# Patient Record
Sex: Female | Born: 1995 | Race: White | Hispanic: No | Smoking: Never smoker
Health system: Southern US, Community
[De-identification: ages and names within clinical notes are randomized; demographics above are authoritative.]

## PROBLEM LIST (undated history)

## (undated) ENCOUNTER — Inpatient Hospital Stay (HOSPITAL_COMMUNITY): Payer: Self-pay

## (undated) DIAGNOSIS — R55 Syncope and collapse: Secondary | ICD-10-CM

## (undated) DIAGNOSIS — N390 Urinary tract infection, site not specified: Secondary | ICD-10-CM

## (undated) DIAGNOSIS — Z789 Other specified health status: Secondary | ICD-10-CM

## (undated) HISTORY — DX: Syncope and collapse: R55

## (undated) HISTORY — PX: TOOTH EXTRACTION: SUR596

---
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
## 2013-01-20 ENCOUNTER — Telehealth: Payer: Self-pay | Admitting: Family Medicine

## 2013-01-20 ENCOUNTER — Encounter: Payer: Self-pay | Admitting: Family Medicine

## 2013-01-20 ENCOUNTER — Ambulatory Visit (INDEPENDENT_AMBULATORY_CARE_PROVIDER_SITE_OTHER): Payer: PRIVATE HEALTH INSURANCE | Admitting: Family Medicine

## 2013-01-20 VITALS — BP 120/64 | HR 88 | Temp 98.2°F | Ht 63.25 in | Wt 104.5 lb

## 2013-01-20 DIAGNOSIS — R109 Unspecified abdominal pain: Secondary | ICD-10-CM

## 2013-01-20 DIAGNOSIS — R1031 Right lower quadrant pain: Secondary | ICD-10-CM

## 2013-01-20 DIAGNOSIS — R10A0 Flank pain, unspecified side: Secondary | ICD-10-CM

## 2013-01-20 LAB — COMPREHENSIVE METABOLIC PANEL
ALT: 10 U/L (ref 0–35)
AST: 13 U/L (ref 0–37)
Alkaline Phosphatase: 64 U/L (ref 39–117)
Calcium: 9.1 mg/dL (ref 8.4–10.5)
Chloride: 106 mEq/L (ref 96–112)
Creatinine, Ser: 0.9 mg/dL (ref 0.4–1.2)

## 2013-01-20 LAB — POCT URINALYSIS DIPSTICK
Nitrite, UA: NEGATIVE
Urobilinogen, UA: NEGATIVE
pH, UA: 7

## 2013-01-20 LAB — CBC WITH DIFFERENTIAL/PLATELET
Basophils Absolute: 0 10*3/uL (ref 0.0–0.1)
Eosinophils Absolute: 0.1 10*3/uL (ref 0.0–0.7)
Hemoglobin: 13.6 g/dL (ref 12.0–15.0)
Lymphocytes Relative: 19 % (ref 12.0–46.0)
MCHC: 34 g/dL (ref 30.0–36.0)
Neutro Abs: 6.3 10*3/uL (ref 1.4–7.7)
Neutrophils Relative %: 73.1 % (ref 43.0–77.0)
RDW: 13.3 % (ref 11.5–14.6)

## 2013-01-20 MED ORDER — CIPROFLOXACIN HCL 250 MG PO TABS: 250.0000 mg | ORAL_TABLET | Freq: Two times a day (BID) | ORAL | Status: DC

## 2013-01-20 NOTE — Assessment & Plan Note (Addendum)
Many WBC in urine, occ skin cells... No clear UTI symptoms other than abdominal pain... Will send for culture.  Will start her on Cipro x 3 days. Did not evaluate for STDs given pt states she has never had sex... U preg negative. Some concern for appendicitis given level of pain and movement of pain to right lower quadrant, but pt very comfortable in office. Will check a cbc today, if elevated.. Send for CT. We will hold on CT of abdomen at this time but mother instructed if pain worsening... Go to ER for CT to evaluate for appendicitis.

## 2013-01-20 NOTE — Telephone Encounter (Signed)
Patient Information:  Caller Name: Cala Bradford  Phone: [phone]  Patient: Heidi Orr, Heidi Orr  Gender: Female  DOB: [birth year]  Age: 17 Years  PCP: Kerby Nora (Family Practice)  Pregnant: No  Office Follow Up:  Does the office need to follow up with this patient?: No  Instructions For The Office: N/A   Symptoms  Reason For Call & Symptoms: Mom states child has complained of left side pain.  Reviewed Health History In EMR: Yes  Reviewed Medications In EMR: Yes  Reviewed Allergies In EMR: Yes  Reviewed Surgeries / Procedures: Yes  Date of Onset of Symptoms: 01/19/2013  Treatments Tried: Tylenol 1 tab  Treatments Tried Worked: No OB / GYN:  LMP: 01/09/2013  Guideline(s) Used:  Abdominal Pain - Female  Disposition Per Guideline:   See Today in Office  Reason For Disposition Reached:   Moderate or mild pain that comes and goes (cramps) lasts > 24 hours  Advice Given:  Call Back If:  You become worse.  Patient Will Follow Care Advice:  YES  Appointment Scheduled:  01/20/2013 11:45:00 Appointment Scheduled Provider:  Kerby Nora [facility])

## 2013-01-20 NOTE — Progress Notes (Signed)
  Subjective:    Patient ID: Heidi Orr, female    DOB: [birth year], 17 y.o.   MRN: [id number]  HPI  17 year old female previously healthy presents with 24 hours of new onset pain in left side when she awoke. Intermittent left mid lateral abdomen, sharp stabbing pain. Went away but then it came back on right side.  3/10 on pain scale at this moment..yesterday >10/10. No associated symptoms initially... Later in the day felt nausea, no emesis.  No Constipation or diarrhea, last BM yesterday.  No gas or bloating. No fever. No recent travel, no antibiotics. No dysuria.  Has tolerated jello, sandwich, drinking  Sugar-free koolaid.  Last menses 5/14, regular. She states she has never had sex.   Review of Systems  Constitutional: Negative for fever and fatigue.  HENT: Negative for ear pain.   Eyes: Negative for pain.  Respiratory: Negative for chest tightness and shortness of breath.   Cardiovascular: Negative for chest pain, palpitations and leg swelling.  Gastrointestinal: Negative for abdominal pain.  Genitourinary: Negative for dysuria.       Objective:   Physical Exam  Constitutional: Vital signs are normal. She appears well-developed and well-nourished. She is cooperative.  Non-toxic appearance. She does not appear ill. No distress.  HENT:  Head: Normocephalic.  Right Ear: Hearing, tympanic membrane, external ear and ear canal normal. Tympanic membrane is not erythematous, not retracted and not bulging.  Left Ear: Hearing, tympanic membrane, external ear and ear canal normal. Tympanic membrane is not erythematous, not retracted and not bulging.  Nose: No mucosal edema or rhinorrhea. Right sinus exhibits no maxillary sinus tenderness and no frontal sinus tenderness. Left sinus exhibits no maxillary sinus tenderness and no frontal sinus tenderness.  Mouth/Throat: Uvula is midline, oropharynx is clear and moist and mucous membranes are normal.  Eyes: Conjunctivae, EOM and lids  are normal. Pupils are equal, round, and reactive to light. No foreign bodies found.  Neck: Trachea normal and normal range of motion. Neck supple. Carotid bruit is not present. No mass and no thyromegaly present.  Cardiovascular: Normal rate, regular rhythm, S1 normal, S2 normal, normal heart sounds, intact distal pulses and normal pulses.  Exam reveals no gallop and no friction rub.   No murmur heard. Pulmonary/Chest: Effort normal and breath sounds normal. Not tachypneic. No respiratory distress. She has no decreased breath sounds. She has no wheezes. She has no rhonchi. She has no rales.  Abdominal: Soft. Normal appearance and bowel sounds are normal. There is no hepatosplenomegaly. There is tenderness in the right upper quadrant, right lower quadrant, periumbilical area and suprapubic area. There is no rigidity, no rebound, no guarding, no CVA tenderness, no tenderness at McBurney's point and negative Murphy's sign.  Pt is very comfortable on exam  Neurological: She is alert.  Skin: Skin is warm, dry and intact. No rash noted.  Psychiatric: Her speech is normal and behavior is normal. Judgment and thought content normal. Her mood appears not anxious. Cognition and memory are normal. She does not exhibit a depressed mood.          Assessment & Plan:

## 2013-01-20 NOTE — Patient Instructions (Addendum)
Push fluids. Complete antibiotics. Stop at lab on your way out. If not improving with treatment for urinary infection.. Follow up on Tuesday. Go to ER if pain in right lower abdomen increasing.

## 2013-01-22 LAB — URINE CULTURE: Colony Count: >=100000

## 2013-02-02 ENCOUNTER — Ambulatory Visit: Payer: PRIVATE HEALTH INSURANCE | Admitting: Family Medicine

## 2013-02-03 ENCOUNTER — Ambulatory Visit: Payer: Medicare PPO | Admitting: Family Medicine

## 2013-02-14 ENCOUNTER — Ambulatory Visit (INDEPENDENT_AMBULATORY_CARE_PROVIDER_SITE_OTHER): Payer: PRIVATE HEALTH INSURANCE | Admitting: Family Medicine

## 2013-02-14 ENCOUNTER — Encounter: Payer: Self-pay | Admitting: Family Medicine

## 2013-02-14 VITALS — BP 90/60 | HR 83 | Temp 98.1°F | Ht 63.25 in | Wt 102.2 lb

## 2013-02-14 DIAGNOSIS — N920 Excessive and frequent menstruation with regular cycle: Secondary | ICD-10-CM

## 2013-02-14 DIAGNOSIS — Z0000 Encounter for general adult medical examination without abnormal findings: Secondary | ICD-10-CM

## 2013-02-14 MED ORDER — NORETHINDRONE ACET-ETHINYL EST 1-20 MG-MCG PO TABS: ORAL_TABLET | ORAL | Status: DC

## 2013-02-14 NOTE — Assessment & Plan Note (Signed)
Stable on OCPs.

## 2013-02-14 NOTE — Progress Notes (Signed)
Subjective:    Patient ID: Heidi Orr, female    DOB: 1995-11-15, 17 y.o.   MRN: [id number]  HPI  The patient is here for annual wellness exam and preventative care.    She had resolution of abdominal pain with treatment of UTI. No urinary symptoms at this time.  Has noted umbilicus red looking x 1 week.  No pain. No itching.  Menorrhagia: stable on microgestin.  History   Social History  . Marital Status: Single    Spouse Name: N/A    Number of Children: N/A  . Years of Education: N/A   Occupational History  . Student    Social History Main Topics  . Smoking status: Never Smoker   . Smokeless tobacco: None  . Alcohol Use: No  . Drug Use: No  . Sexually Active: No   Other Topics Concern  . None   Social History Narrative  . None  No regular exercise. Limited veggies, good fruit, occ fast food     Review of Systems  Constitutional: Negative for fever, fatigue and unexpected weight change.  HENT: Negative for ear pain, congestion, sore throat, sneezing, trouble swallowing and sinus pressure.   Eyes: Negative for pain and itching.  Respiratory: Negative for cough, chest tightness, shortness of breath and wheezing.   Cardiovascular: Negative for chest pain, palpitations and leg swelling.  Gastrointestinal: Negative for nausea, abdominal pain, diarrhea, constipation and blood in stool.  Genitourinary: Negative for dysuria, hematuria, vaginal discharge, difficulty urinating and menstrual problem.  Skin: Negative for rash.  Neurological: Negative for syncope, weakness, light-headedness, numbness and headaches.  Psychiatric/Behavioral: Negative for confusion and dysphoric mood. The patient is not nervous/anxious.        Objective:   Physical Exam  Constitutional: Vital signs are normal. She appears well-developed and well-nourished. She is cooperative.  Non-toxic appearance. She does not appear ill. No distress.  HENT:  Head: Normocephalic.  Right Ear:  Hearing, tympanic membrane, external ear and ear canal normal.  Left Ear: Hearing, tympanic membrane, external ear and ear canal normal.  Nose: Nose normal.  Eyes: Conjunctivae, EOM and lids are normal. Pupils are equal, round, and reactive to light. No foreign bodies found.  Neck: Trachea normal and normal range of motion. Neck supple. Carotid bruit is not present. No mass and no thyromegaly present.  Cardiovascular: Normal rate, regular rhythm, S1 normal, S2 normal, normal heart sounds and intact distal pulses.  Exam reveals no gallop.   No murmur heard. Pulmonary/Chest: Effort normal and breath sounds normal. No respiratory distress. She has no wheezes. She has no rhonchi. She has no rales.  Abdominal: Soft. Normal appearance and bowel sounds are normal. She exhibits no distension, no fluid wave, no abdominal bruit and no mass. There is no hepatosplenomegaly. There is no tenderness. There is no rebound, no guarding and no CVA tenderness. No hernia.  Lymphadenopathy:    She has no cervical adenopathy.    She has no axillary adenopathy.  Neurological: She is alert. She has normal strength. No cranial nerve deficit or sensory deficit.  Skin: Skin is warm, dry and intact. No rash noted.  Psychiatric: Her speech is normal and behavior is normal. Judgment normal. Her mood appears not anxious. Cognition and memory are normal. She does not exhibit a depressed mood.          Assessment & Plan:  The patient's preventative maintenance and recommended screening tests for an annual wellness exam were reviewed in full today. Brought up to  date unless services declined.  Counselled on the importance of diet, exercise, and its role in overall health and mortality. The patient's FH and SH was reviewed, including their home life, tobacco status, and drug and alcohol status.   Uptodate with HPV, Td, meningitis No pap until age 42.  No need for std testing, no sexual activity.

## 2013-02-14 NOTE — Patient Instructions (Addendum)
Can try small amount of monistat on belly button daily for 7 days for possible small yeast infection.  Limit fast food, increase veggies. Drink water.

## 2013-04-07 ENCOUNTER — Other Ambulatory Visit: Payer: Self-pay | Admitting: *Deleted

## 2013-04-07 MED ORDER — NORETHINDRONE ACET-ETHINYL EST 1-20 MG-MCG PO TABS: ORAL_TABLET | ORAL | Status: DC

## 2013-10-18 ENCOUNTER — Other Ambulatory Visit: Payer: Self-pay

## 2013-10-18 MED ORDER — NORETHINDRONE ACET-ETHINYL EST 1-20 MG-MCG PO TABS: ORAL_TABLET | ORAL | Status: DC

## 2013-10-18 NOTE — Telephone Encounter (Signed)
pts mother request one pack ob BC pill to [facility] until gets mail order.advised done.

## 2013-12-11 ENCOUNTER — Other Ambulatory Visit: Payer: Self-pay | Admitting: Family Medicine

## 2013-12-11 MED ORDER — NORETHIN ACE-ETH ESTRAD-FE 1-20 MG-MCG PO TABS: 1.0000 | ORAL_TABLET | Freq: Every day | ORAL | Status: DC

## 2013-12-11 NOTE — Telephone Encounter (Signed)
[facility] faxed refill request for Junel 1/20.  Last physical 01/2013.  I don't see this medication on hx meds or current med list.

## 2013-12-12 ENCOUNTER — Other Ambulatory Visit: Payer: Self-pay | Admitting: *Deleted

## 2013-12-12 MED ORDER — NORETHIN ACE-ETH ESTRAD-FE 1-20 MG-MCG PO TABS: 1.0000 | ORAL_TABLET | Freq: Every day | ORAL | Status: DC

## 2014-01-10 ENCOUNTER — Ambulatory Visit (INDEPENDENT_AMBULATORY_CARE_PROVIDER_SITE_OTHER): Payer: PRIVATE HEALTH INSURANCE | Admitting: Family Medicine

## 2014-01-10 ENCOUNTER — Encounter: Payer: Self-pay | Admitting: Family Medicine

## 2014-01-10 VITALS — BP 92/62 | HR 79 | Temp 97.8°F | Ht 62.75 in | Wt 100.0 lb

## 2014-01-10 DIAGNOSIS — K589 Irritable bowel syndrome without diarrhea: Secondary | ICD-10-CM | POA: Insufficient documentation

## 2014-01-10 DIAGNOSIS — N915 Oligomenorrhea, unspecified: Secondary | ICD-10-CM

## 2014-01-10 DIAGNOSIS — R109 Unspecified abdominal pain: Secondary | ICD-10-CM

## 2014-01-10 DIAGNOSIS — R1030 Lower abdominal pain, unspecified: Secondary | ICD-10-CM

## 2014-01-10 LAB — POCT URINALYSIS DIPSTICK
BILIRUBIN UA: NEGATIVE
Blood, UA: NEGATIVE
GLUCOSE UA: NEGATIVE
Ketones, UA: NEGATIVE
Leukocytes, UA: NEGATIVE
NITRITE UA: NEGATIVE
Protein, UA: NEGATIVE
UROBILINOGEN UA: 0.2
pH, UA: 6

## 2014-01-10 NOTE — Patient Instructions (Signed)
Urinalysis today - we will call you with results  Watch your temp for fever  Get zantac over the counter 150 mg and take it twice daily - 2 weeks and if no improvement let us know  Avoid spicy food / also carbonated beverages

## 2014-01-10 NOTE — Progress Notes (Signed)
Pre visit review using our clinic review tool, if applicable. No additional management support is needed unless otherwise documented below in the visit note. 

## 2014-01-10 NOTE — Progress Notes (Signed)
Subjective:    Patient ID: Heidi MassonKatie L Orr, female    DOB: 1995/09/22, 18 y.o.   MRN: [id number]  HPI Here for irregular periods   For 1-2 months having problems  Intermittently for days at a time (2-3 days) she gets abdominal pain -- from middle down  No particular trigger  If she eats during an episode it will worsen  Also nauseated - just yesterday - no vomiting , nausea lasted all day with stomach pain also  Was out of work  Was sweating and ? 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 Menorrhagia 11/18/2011   Past Medical History  Diagnosis Date  . Syncope 2009   No past surgical history on file. History  Substance Use Topics  . Smoking status: Never Smoker   . Smokeless tobacco: Not on file  . Alcohol Use: No   No family history on file. No Known Allergies Current Outpatient Prescriptions on File Prior to Visit  Medication Sig Dispense Refill  . norethindrone-ethinyl estradiol (JUNEL FE 1/20) 1-20 MG-MCG tablet Take 1 tablet by mouth daily.  3 Package  0   No current facility-administered medications on file prior to visit.     Review of Systems Review of Systems  Constitutional: Negative for fever, appetite change, fatigue and unexpected weight change.  Eyes: Negative for pain and visual disturbance.  Respiratory: Negative for cough and shortness of breath.   Cardiovascular: Negative for cp or palpitations    Gastrointestinal: Negative for  diarrhea and constipation. neg for blood in stool or rectal pain Genitourinary: Negative for urgency  and frequency. neg for hematuria or flank pain  Skin: Negative for pallor or rash   Neurological: Negative for weakness, light-headedness, numbness and headaches.  Hematological: Negative for adenopathy. Does not bruise/bleed easily.  Psychiatric/Behavioral: Negative for dysphoric mood. The patient is not nervous/anxious.         Objective:   Physical Exam  Constitutional: She appears well-developed and well-nourished. No distress.  HENT:  Head: Normocephalic and atraumatic.  Mouth/Throat: Oropharynx is clear and moist.  Eyes: Conjunctivae and EOM are normal. Pupils are equal, round, and reactive to light. Right eye exhibits no discharge. Left eye exhibits no discharge. No scleral icterus.  Neck: Normal range of motion. Neck supple.  Cardiovascular: Normal rate, regular rhythm and intact distal pulses.   Pulmonary/Chest: Effort normal and breath sounds normal. No respiratory distress. She has no wheezes. She has no rales.  Abdominal: Soft. Bowel sounds are normal. She exhibits no distension and no mass. There is tenderness. There is no rebound and no guarding.  Mild suprapubic tenderness without rebound or guarding   Musculoskeletal: She exhibits no edema and no tenderness.  Lymphadenopathy:    She has no cervical adenopathy.  Neurological: She is alert.  Skin: Skin is warm and dry. No rash noted. No erythema. No pallor.  Psychiatric: She has a normal mood and affect.  Assessment & Plan:

## 2014-01-11 NOTE — Assessment & Plan Note (Signed)
Suspect this is due to her OC -periods are light but regular and not skipping them  I adv her to continue current OC unless period stops or changes further

## 2014-01-11 NOTE — Assessment & Plan Note (Signed)
ua and urine preg test neg today  This occurs intermittently - ? If resp to acid or rel to gastritis (no lower GI symptoms) Trial of zantac 150 bid and update  If no imp consider pelvic US  / and f/u with PCP

## 2014-01-18 ENCOUNTER — Telehealth: Payer: Self-pay | Admitting: Family Medicine

## 2014-01-18 DIAGNOSIS — N915 Oligomenorrhea, unspecified: Secondary | ICD-10-CM

## 2014-01-18 DIAGNOSIS — R102 Pelvic and perineal pain: Secondary | ICD-10-CM

## 2014-01-18 DIAGNOSIS — R1030 Lower abdominal pain, unspecified: Secondary | ICD-10-CM

## 2014-01-18 NOTE — Telephone Encounter (Signed)
Mom called as Dr. Milinda Antisower requested, pt's stomach pain is a little better some days with the medication Zantac (BID), some days Florentina AddisonKatie does not feel like it is helping at all.   Please advise.  Best number to call Vassie MoselleKim Croson is [phone]

## 2014-01-18 NOTE — Telephone Encounter (Signed)
Pt notified US ordered and Marion/Linda will call to schedule appt., f/u appt scheduled with Dr. Ermalene SearingBedsole next Friday 01/26/14

## 2014-01-18 NOTE — Telephone Encounter (Signed)
Is most of her pain in upper or lower abdomen now ? (above or below the belly button) - any other symptoms like nausea or heartburn?

## 2014-01-18 NOTE — Telephone Encounter (Signed)
Given just seen by Dr. Milinda Antisower. Will forward to her for her input.? Move ahead with US as note suggested vs follow up first?

## 2014-01-18 NOTE — Telephone Encounter (Signed)
Mother said the pain is in her lower abd, below belly button, pt isn't having any other sxs just the pain, no nausea or heartburn, please advise

## 2014-01-18 NOTE — Telephone Encounter (Signed)
I want to go forward with a pelvic ultrasound Then please sched f/u with PCP a week later  Will order that now

## 2014-01-19 ENCOUNTER — Ambulatory Visit
Admission: RE | Admit: 2014-01-19 | Discharge: 2014-01-19 | Disposition: A | Discharge status: Home / Self Care | Payer: PRIVATE HEALTH INSURANCE | Source: Ambulatory Visit | Attending: Family Medicine | Admitting: Family Medicine

## 2014-01-19 DIAGNOSIS — R102 Pelvic and perineal pain: Secondary | ICD-10-CM

## 2014-01-19 DIAGNOSIS — R1030 Lower abdominal pain, unspecified: Secondary | ICD-10-CM

## 2014-01-19 DIAGNOSIS — N915 Oligomenorrhea, unspecified: Secondary | ICD-10-CM

## 2014-01-26 ENCOUNTER — Encounter: Payer: Self-pay | Admitting: Family Medicine

## 2014-01-26 ENCOUNTER — Ambulatory Visit (INDEPENDENT_AMBULATORY_CARE_PROVIDER_SITE_OTHER): Payer: PRIVATE HEALTH INSURANCE | Admitting: Family Medicine

## 2014-01-26 VITALS — BP 100/60 | HR 62 | Temp 98.2°F | Ht 62.75 in | Wt 99.5 lb

## 2014-01-26 DIAGNOSIS — R1030 Lower abdominal pain, unspecified: Secondary | ICD-10-CM

## 2014-01-26 DIAGNOSIS — R102 Pelvic and perineal pain: Secondary | ICD-10-CM

## 2014-01-26 DIAGNOSIS — R634 Abnormal weight loss: Secondary | ICD-10-CM

## 2014-01-26 DIAGNOSIS — R109 Unspecified abdominal pain: Secondary | ICD-10-CM

## 2014-01-26 LAB — CBC WITH DIFFERENTIAL/PLATELET
BASOS PCT: 0.3 % (ref 0.0–3.0)
Basophils Absolute: 0 10*3/uL (ref 0.0–0.1)
EOS ABS: 0.1 10*3/uL (ref 0.0–0.7)
EOS PCT: 1.3 % (ref 0.0–5.0)
HEMATOCRIT: 39.2 % (ref 36.0–49.0)
Hemoglobin: 13.3 g/dL (ref 12.0–16.0)
LYMPHS ABS: 1.3 10*3/uL (ref 0.7–4.0)
Lymphocytes Relative: 26.3 % (ref 24.0–48.0)
MCHC: 33.9 g/dL (ref 31.0–37.0)
MCV: 87 fl (ref 78.0–98.0)
MONO ABS: 0.4 10*3/uL (ref 0.1–1.0)
Monocytes Relative: 7.4 % (ref 3.0–12.0)
NEUTROS ABS: 3.2 10*3/uL (ref 1.4–7.7)
Neutrophils Relative %: 64.7 % (ref 43.0–71.0)
Platelets: 203 10*3/uL (ref 150.0–575.0)
RBC: 4.51 Mil/uL (ref 3.80–5.70)
RDW: 13.2 % (ref 11.4–15.5)
WBC: 4.9 10*3/uL (ref 4.5–13.5)

## 2014-01-26 LAB — COMPREHENSIVE METABOLIC PANEL
ALBUMIN: 3.9 g/dL (ref 3.5–5.2)
ALK PHOS: 55 U/L (ref 47–119)
ALT: 12 U/L (ref 0–35)
AST: 19 U/L (ref 0–37)
BILIRUBIN TOTAL: 0.4 mg/dL (ref 0.2–1.2)
BUN: 12 mg/dL (ref 6–23)
CO2: 26 mEq/L (ref 19–32)
Calcium: 9.3 mg/dL (ref 8.4–10.5)
Chloride: 107 mEq/L (ref 96–112)
Creatinine, Ser: 0.9 mg/dL (ref 0.4–1.2)
GFR: 82.03 mL/min (ref >60.00)
GLUCOSE: 57 mg/dL — AB (ref 70–99)
POTASSIUM: 4 meq/L (ref 3.5–5.1)
Sodium: 139 mEq/L (ref 135–145)
Total Protein: 7.1 g/dL (ref 6.0–8.3)

## 2014-01-26 LAB — TSH: TSH: 1.18 u[IU]/mL (ref 0.40–5.00)

## 2014-01-26 MED ORDER — DICYCLOMINE HCL 10 MG PO CAPS: ORAL_CAPSULE | ORAL | Status: DC

## 2014-01-26 NOTE — Patient Instructions (Addendum)
Stop at lab on way out. Avoid greasy foods, fried foods. Start daily probiotic (lactobacclii) Librarian, academic.  INcrease fiber and water in diet. If abdominal can use medication for abdominal cramping.  Follow up in 1 month.

## 2014-01-26 NOTE — Progress Notes (Signed)
Pre visit review using our clinic review tool, if applicable. No additional management support is needed unless otherwise documented below in the visit note. 

## 2014-01-26 NOTE — Progress Notes (Signed)
Subjective:    Patient ID: Heidi MassonKatie L Raglin, female    DOB: Jan 25, 1996, 18 y.o.   MRN: [id number]  HPI  18 year old female  With history of chronic abdominal pain presents for follow up abdominal pain,. She was seen on 5/13 by Dr. Milinda Antisower.   Prior history: For 1-2 months having problems  Intermittently for days at a time (2-3 days) she gets abdominal pain -- from middle down  No particular trigger  If she eats during an episode it will worsen  Also nauseated - just yesterday - no vomiting , nausea lasted all day with stomach pain also  Was out of work  Was sweating and ? If she could have had a fever  No diarrhea at all  No constipation or blood in stool   Periods are a bit off at times  Very light and short  No breakthrough bleeding  Not missing any pills  Never sexually active   At that appt  DX: Lower abdominal pain - Judy PimpleMarne A Tower, MD at 01/11/2014 3:55 PM     ua and urine preg test neg today  This occurs intermittently - ? If resp to acid or rel to gastritis (no lower GI symptoms)  Trial of zantac 150 bid and update  If no imp consider pelvic US / and f/u with PCP   Nml US pelvic on 5/20  Nml CMET, cbc 01/24/2013   Interval history: She reports Zantac only helped occ, sometimes mafde symptoms worse. She is having mid abdominal pain intermittently, but much less frequently in last few weeks. Last episode was last night mild episode, 20 min. No known triggers, occ wakes up with it, no clear relationship to meals. No D/ C/ Vomiting. No fever.   She eats well but has been losing weight recently in last year. Eats small amounts all day long.   Se denies anorexia and bulemia. Wt Readings from Last 3 Encounters:  01/26/14 99 lb 8 oz (45.133 kg) (3%*, Z = -1.85)  01/10/14 100 lb (45.36 kg) (4%*, Z = -1.80)  02/14/13 102 lb 4 oz (46.38 kg) (6%*, Z = -1.52)   * Growth percentiles are based on CDC 2-20 Years data.    No sexual activity. Never had sex.  Review of  Systems  Constitutional: Negative for fever and fatigue.  HENT: Negative for ear pain.   Eyes: Negative for pain.  Respiratory: Negative for chest tightness and shortness of breath.   Cardiovascular: Negative for chest pain, palpitations and leg swelling.  Gastrointestinal: Positive for abdominal pain. Negative for nausea, diarrhea, constipation, blood in stool, abdominal distention and anal bleeding.  Genitourinary: Negative for dysuria.       Objective:   Physical Exam  Constitutional: Vital signs are normal. She appears well-developed and well-nourished. She is cooperative.  Non-toxic appearance. She does not appear ill. No distress.  thin  HENT:  Head: Normocephalic.  Right Ear: Hearing, tympanic membrane, external ear and ear canal normal. Tympanic membrane is not erythematous, not retracted and not bulging.  Left Ear: Hearing, tympanic membrane, external ear and ear canal normal. Tympanic membrane is not erythematous, not retracted and not bulging.  Nose: No mucosal edema or rhinorrhea. Right sinus exhibits no maxillary sinus tenderness and no frontal sinus tenderness. Left sinus exhibits no maxillary sinus tenderness and no frontal sinus tenderness.  Mouth/Throat: Uvula is midline, oropharynx is clear and moist and mucous membranes are normal.  Eyes: Conjunctivae, EOM and lids are normal. Pupils are  equal, round, and reactive to light. Lids are everted and swept, no foreign bodies found.  Neck: Trachea normal and normal range of motion. Neck supple. Carotid bruit is not present. No mass and no thyromegaly present.  Cardiovascular: Normal rate, regular rhythm, S1 normal, S2 normal, normal heart sounds, intact distal pulses and normal pulses.  Exam reveals no gallop and no friction rub.   No murmur heard. Pulmonary/Chest: Effort normal and breath sounds normal. Not tachypneic. No respiratory distress. She has no decreased breath sounds. She has no wheezes. She has no rhonchi. She has no  rales.  Abdominal: Soft. Normal appearance and bowel sounds are normal. There is no tenderness.  Neurological: She is alert.  Skin: Skin is warm, dry and intact. No rash noted.  Psychiatric: Her speech is normal and behavior is normal. Judgment and thought content normal. Her mood appears not anxious. Cognition and memory are normal. She does not exhibit a depressed mood.          Assessment & Plan:

## 2014-01-26 NOTE — Assessment & Plan Note (Signed)
Nml pelvic US, neg UA and upreg, no sexual activity.  ? IBS.  Treat with lactobaccli, fiber and can try bentyl prn.  Close follow up. If not improving consider CT abd/pelvis vs referral.

## 2014-01-26 NOTE — Assessment & Plan Note (Signed)
Will re-eval labs.  Pt has always be low weight but now in more dangerous level. Eval with labs.

## 2014-02-06 ENCOUNTER — Other Ambulatory Visit: Payer: Self-pay | Admitting: *Deleted

## 2014-02-06 MED ORDER — NORETHIN ACE-ETH ESTRAD-FE 1-20 MG-MCG PO TABS: 1.0000 | ORAL_TABLET | Freq: Every day | ORAL | Status: DC

## 2014-03-01 ENCOUNTER — Encounter: Payer: Self-pay | Admitting: Family Medicine

## 2014-03-01 ENCOUNTER — Ambulatory Visit (INDEPENDENT_AMBULATORY_CARE_PROVIDER_SITE_OTHER): Payer: PRIVATE HEALTH INSURANCE | Admitting: Family Medicine

## 2014-03-01 VITALS — BP 100/70 | HR 65 | Temp 98.1°F | Ht 62.75 in | Wt 102.0 lb

## 2014-03-01 DIAGNOSIS — K589 Irritable bowel syndrome without diarrhea: Secondary | ICD-10-CM

## 2014-03-01 DIAGNOSIS — R634 Abnormal weight loss: Secondary | ICD-10-CM

## 2014-03-01 NOTE — Progress Notes (Signed)
Pre visit review using our clinic review tool, if applicable. No additional management support is needed unless otherwise documented below in the visit note. 

## 2014-03-01 NOTE — Patient Instructions (Signed)
Continue probiotics.  Work on 3 meals a day with healthy snacks. Can still use bently if needed. Schedule CPX in 3 months, weight check.

## 2014-03-01 NOTE — Assessment & Plan Note (Signed)
Work up so far negative.,  Improved with probitoic.  Work on diet changes and increase in fiber. Use bentyl prn.

## 2014-03-01 NOTE — Progress Notes (Signed)
   Subjective:    Patient ID: Heidi MassonKatie L Orr, female    DOB: 06-11-1996, 18 y.o.   MRN: [id number]  HPI 18 year old female pt presents for follow up abdominal pain.  At last appt she was felt to have ? IBS TSH, cbc, CMET were normal. Nml pelvic US, neg UA and upreg, no sexual activity.  Treat with lactobaccli, fiber and can try bentyl prn.    Wt Readings from Last 3 Encounters:  03/01/14 102 lb (46.267 kg) (5%*, Z = -1.64)  01/26/14 99 lb 8 oz (45.133 kg) (3%*, Z = -1.85)  01/10/14 100 lb (45.36 kg) (4%*, Z = -1.80)   * Growth percentiles are based on CDC 2-20 Years data.    She feels the probiotics have helped. She has not had recurrence of severe abdominal pain. Has not increased fiber. She eats less junk food.  She is not eating three meals. Skip breakfast. Has not need bentyl.    Review of Systems  Constitutional: Negative for fever and fatigue.  HENT: Negative for ear pain.   Eyes: Negative for pain.  Respiratory: Negative for chest tightness and shortness of breath.   Cardiovascular: Negative for chest pain, palpitations and leg swelling.  Gastrointestinal: Negative for abdominal pain.  Genitourinary: Negative for dysuria.       Objective:   Physical Exam  Constitutional: Vital signs are normal. She appears well-developed and well-nourished. She is cooperative.  Non-toxic appearance. She does not appear ill. No distress.  HENT:  Head: Normocephalic.  Right Ear: Hearing, tympanic membrane, external ear and ear canal normal. Tympanic membrane is not erythematous, not retracted and not bulging.  Left Ear: Hearing, tympanic membrane, external ear and ear canal normal. Tympanic membrane is not erythematous, not retracted and not bulging.  Nose: No mucosal edema or rhinorrhea. Right sinus exhibits no maxillary sinus tenderness and no frontal sinus tenderness. Left sinus exhibits no maxillary sinus tenderness and no frontal sinus tenderness.  Mouth/Throat: Uvula is  midline, oropharynx is clear and moist and mucous membranes are normal.  Eyes: Conjunctivae, EOM and lids are normal. Pupils are equal, round, and reactive to light. Lids are everted and swept, no foreign bodies found.  Neck: Trachea normal and normal range of motion. Neck supple. Carotid bruit is not present. No mass and no thyromegaly present.  Cardiovascular: Normal rate, regular rhythm, S1 normal, S2 normal, normal heart sounds, intact distal pulses and normal pulses.  Exam reveals no gallop and no friction rub.   No murmur heard. Pulmonary/Chest: Effort normal and breath sounds normal. Not tachypneic. No respiratory distress. She has no decreased breath sounds. She has no wheezes. She has no rhonchi. She has no rales.  Abdominal: Soft. Normal appearance and bowel sounds are normal. There is no tenderness.  Neurological: She is alert.  Skin: Skin is warm, dry and intact. No rash noted.  Psychiatric: Her speech is normal and behavior is normal. Judgment and thought content normal. Her mood appears not anxious. Cognition and memory are normal. She does not exhibit a depressed mood.          Assessment & Plan:

## 2014-03-01 NOTE — Assessment & Plan Note (Signed)
Improving with improved abdominal pain. Encouraged 3 meals a day with snack healthy diet.

## 2014-04-03 ENCOUNTER — Other Ambulatory Visit: Payer: Self-pay | Admitting: *Deleted

## 2014-04-03 MED ORDER — NORETHIN ACE-ETH ESTRAD-FE 1-20 MG-MCG PO TABS: 1.0000 | ORAL_TABLET | Freq: Every day | ORAL | Status: DC

## 2014-06-01 ENCOUNTER — Encounter: Payer: Self-pay | Admitting: Family Medicine

## 2014-06-01 ENCOUNTER — Ambulatory Visit (INDEPENDENT_AMBULATORY_CARE_PROVIDER_SITE_OTHER): Payer: PRIVATE HEALTH INSURANCE | Admitting: Family Medicine

## 2014-06-01 VITALS — BP 110/60 | HR 70 | Temp 98.0°F | Ht 62.75 in | Wt 102.8 lb

## 2014-06-01 DIAGNOSIS — R634 Abnormal weight loss: Secondary | ICD-10-CM

## 2014-06-01 DIAGNOSIS — K589 Irritable bowel syndrome without diarrhea: Secondary | ICD-10-CM

## 2014-06-01 NOTE — Progress Notes (Signed)
Pre visit review using our clinic review tool, if applicable. No additional management support is needed unless otherwise documented below in the visit note. 

## 2014-06-01 NOTE — Assessment & Plan Note (Signed)
Can try higher dose bentyl to see if it is more effective.

## 2014-06-01 NOTE — Progress Notes (Signed)
   Subjective:    Patient ID: Heidi MassonKatie L Orr, female    DOB: 06/02/1996, 18 y.o.   MRN: [id number]  HPI  18 year old female presents for 3 month follow up abnormal weight loss and IBS. She has maintained her weight since the last OV. She stills skip meals, but is trying to eat more. No bulmia symptoms. She feels good about her appearance. No depression, no anxiety, minimal stress. She has had only 2 episodes of abdominal cramping, she tried bentyl but it did not help at all. No clear triggers. BMI:18.3 She is going to online school. Wt Readings from Last 3 Encounters:  06/01/14 102 lb 12 oz (46.607 kg) (6%*, Z = -1.59)  03/01/14 102 lb (46.267 kg) (5%*, Z = -1.64)  01/26/14 99 lb 8 oz (45.133 kg) (3%*, Z = -1.85)   * Growth percentiles are based on CDC 2-20 Years data.      Review of Systems  Constitutional: Negative for fever and fatigue.  HENT: Negative for ear pain.   Eyes: Negative for pain.  Respiratory: Negative for chest tightness and shortness of breath.   Cardiovascular: Negative for chest pain, palpitations and leg swelling.  Gastrointestinal: Negative for abdominal pain.  Genitourinary: Negative for dysuria.       Objective:   Physical Exam  Constitutional: Vital signs are normal. She appears well-developed and well-nourished. She is cooperative.  Non-toxic appearance. She does not appear ill. No distress.  HENT:  Head: Normocephalic.  Right Ear: Hearing, tympanic membrane, external ear and ear canal normal. Tympanic membrane is not erythematous, not retracted and not bulging.  Left Ear: Hearing, tympanic membrane, external ear and ear canal normal. Tympanic membrane is not erythematous, not retracted and not bulging.  Nose: No mucosal edema or rhinorrhea. Right sinus exhibits no maxillary sinus tenderness and no frontal sinus tenderness. Left sinus exhibits no maxillary sinus tenderness and no frontal sinus tenderness.  Mouth/Throat: Uvula is midline,  oropharynx is clear and moist and mucous membranes are normal.  Eyes: Conjunctivae, EOM and lids are normal. Pupils are equal, round, and reactive to light. Lids are everted and swept, no foreign bodies found.  Neck: Trachea normal and normal range of motion. Neck supple. Carotid bruit is not present. No mass and no thyromegaly present.  Cardiovascular: Normal rate, regular rhythm, S1 normal, S2 normal, normal heart sounds, intact distal pulses and normal pulses.  Exam reveals no gallop and no friction rub.   No murmur heard. Pulmonary/Chest: Effort normal and breath sounds normal. Not tachypneic. No respiratory distress. She has no decreased breath sounds. She has no wheezes. She has no rhonchi. She has no rales.  Abdominal: Soft. Normal appearance and bowel sounds are normal. There is no tenderness.  Neurological: She is alert.  Skin: Skin is warm, dry and intact. No rash noted.  Psychiatric: Her speech is normal and behavior is normal. Judgment and thought content normal. Her mood appears not anxious. Cognition and memory are normal. She does not exhibit a depressed mood.          Assessment & Plan:

## 2014-06-01 NOTE — Patient Instructions (Signed)
Start Pediasure in addition to meals or if you know you are going to skip a meal.  work on increasing healthy eating.  If abdominal cramping you can try 2 tabs of 10 mg bentyl as needed.

## 2014-06-01 NOTE — Assessment & Plan Note (Signed)
Start pediasure. Increase calories.

## 2014-10-01 ENCOUNTER — Telehealth: Payer: Self-pay | Admitting: Family Medicine

## 2014-10-01 ENCOUNTER — Other Ambulatory Visit: Payer: PRIVATE HEALTH INSURANCE

## 2014-10-01 NOTE — Telephone Encounter (Signed)
Does not need routine labs unless interested in STD screening. Please let [pt know.

## 2014-10-01 NOTE — Telephone Encounter (Signed)
-----   Message from Alvina Chouerri J Walsh sent at 09/26/2014  3:11 PM EST ----- Regarding: Lab orders for Monday, 2.1.16 Patient is scheduled for CPX labs, please order future labs, Thanks , Camelia Engerri

## 2014-10-05 ENCOUNTER — Ambulatory Visit (INDEPENDENT_AMBULATORY_CARE_PROVIDER_SITE_OTHER): Payer: PRIVATE HEALTH INSURANCE | Admitting: Family Medicine

## 2014-10-05 ENCOUNTER — Encounter: Payer: Self-pay | Admitting: Family Medicine

## 2014-10-05 VITALS — BP 96/70 | HR 65 | Temp 97.8°F | Ht 63.0 in | Wt 104.0 lb

## 2014-10-05 DIAGNOSIS — Z23 Encounter for immunization: Secondary | ICD-10-CM

## 2014-10-05 DIAGNOSIS — Z2911 Encounter for prophylactic immunotherapy for respiratory syncytial virus (RSV): Secondary | ICD-10-CM

## 2014-10-05 DIAGNOSIS — Z0000 Encounter for general adult medical examination without abnormal findings: Secondary | ICD-10-CM

## 2014-10-05 NOTE — Progress Notes (Signed)
Pre visit review using our clinic review tool, if applicable. No additional management support is needed unless otherwise documented below in the visit note. 

## 2014-10-05 NOTE — Addendum Note (Signed)
Addended by: Damita LackLORING, Mikalyn Hermida S on: 10/05/2014 12:27 PM   Modules accepted: Orders

## 2014-10-05 NOTE — Progress Notes (Signed)
The patient is here for annual wellness exam and preventative care.    She is on OCPs ( microgestin), she has been on it in last  4 years. She had noted no SE, taking regularly. She did not have menses in last 2 months. When she was supposed to get menses she had some typical menses symptoms without the bleeding. No sexually activity. Never has had sex.   She has gained some weight. Trying to eat more regularly. No regular exercise. Limited veggies, good fruit, occ fast food  Wt Readings from Last 3 Encounters:  10/05/14 104 lb (47.174 kg)  06/01/14 102 lb 12 oz (46.607 kg) (6 %*, Z = -1.59)  03/01/14 102 lb (46.267 kg) (5 %*, Z = -1.64)   * Growth percentiles are based on CDC 2-20 Years data.     History   Social History  . Marital Status: Single    Spouse Name: N/A    Number of Children: N/A  . Years of Education: N/A   Occupational History  . Student    Social History Main Topics  . Smoking status: Never Smoker   . Smokeless tobacco: None  . Alcohol Use: No  . Drug Use: No  . Sexually Active: No   Other Topics Concern  . None   Social History Narrative  . None     Review of Systems  Constitutional: Negative for fever, fatigue and unexpected weight change.  HENT: Negative for ear pain, congestion, sore throat, sneezing, trouble swallowing and sinus pressure.  Eyes: Negative for pain and itching.  Respiratory: Negative for cough, chest tightness, shortness of breath and wheezing.  Cardiovascular: Negative for chest pain, palpitations and leg swelling.  Gastrointestinal: Negative for nausea, abdominal pain, diarrhea, constipation and blood in stool.  Genitourinary: Negative for dysuria, hematuria, vaginal discharge, difficulty urinating and menstrual problem.  Skin: Negative for rash.  Neurological: Negative for syncope, weakness, light-headedness, numbness and headaches.  Psychiatric/Behavioral: Negative  for confusion and dysphoric mood. The patient is not nervous/anxious.       Objective:   Physical Exam  Constitutional: Vital signs are normal. She appears well-developed and well-nourished. She is cooperative. Non-toxic appearance. She does not appear ill. No distress.  HENT:  Head: Normocephalic.  Right Ear: Hearing, tympanic membrane, external ear and ear canal normal.  Left Ear: Hearing, tympanic membrane, external ear and ear canal normal.  Nose: Nose normal.  Eyes: Conjunctivae, EOM and lids are normal. Pupils are equal, round, and reactive to light. No foreign bodies found.  Neck: Trachea normal and normal range of motion. Neck supple. Carotid bruit is not present. No mass and no thyromegaly present.  Cardiovascular: Normal rate, regular rhythm, S1 normal, S2 normal, normal heart sounds and intact distal pulses. Exam reveals no gallop.  No murmur heard. Pulmonary/Chest: Effort normal and breath sounds normal. No respiratory distress. She has no wheezes. She has no rhonchi. She has no rales.  Abdominal: Soft. Normal appearance and bowel sounds are normal. She exhibits no distension, no fluid wave, no abdominal bruit and no mass. There is no hepatosplenomegaly. There is no tenderness. There is no rebound, no guarding and no CVA tenderness. No hernia.  Lymphadenopathy:   She has no cervical adenopathy.   She has no axillary adenopathy.  Neurological: She is alert. She has normal strength. No cranial nerve deficit or sensory deficit.  Skin: Skin is warm, dry and intact. No rash noted.  Psychiatric: Her speech is normal and behavior is normal. Judgment normal.  Her mood appears not anxious. Cognition and memory are normal. She does not exhibit a depressed mood.          Assessment & Plan:  The patient's preventative maintenance and recommended screening tests for an annual wellness exam were reviewed in full today. Brought up to date unless services  declined.  Counselled on the importance of diet, exercise, and its role in overall health and mortality. The patient's FH and SH was reviewed, including their home life, tobacco status, and drug and alcohol status.   Uptodate with HPV, Td, meningitis, due for flu No pap until age 68.  No need for std testing, no sexual activity.

## 2014-10-05 NOTE — Patient Instructions (Signed)
Continue working on healthy eating , no skipping meals. Call if menses continues to be irregular for and extended period of time.

## 2014-10-19 ENCOUNTER — Other Ambulatory Visit: Payer: Self-pay

## 2014-10-19 MED ORDER — NORETHIN ACE-ETH ESTRAD-FE 1-20 MG-MCG PO TABS: 1.0000 | ORAL_TABLET | Freq: Every day | ORAL | Status: DC

## 2014-10-19 NOTE — Telephone Encounter (Signed)
Pts mother request refill BC pill to [facility]; advised done.

## 2015-05-26 ENCOUNTER — Emergency Department (INDEPENDENT_AMBULATORY_CARE_PROVIDER_SITE_OTHER)
Admission: EM | Admit: 2015-05-26 | Discharge: 2015-05-26 | Disposition: A | Discharge status: Home / Self Care | Payer: 59 | Source: Home / Self Care | Attending: Emergency Medicine | Admitting: Emergency Medicine

## 2015-05-26 ENCOUNTER — Encounter (HOSPITAL_COMMUNITY): Payer: Self-pay | Admitting: *Deleted

## 2015-05-26 DIAGNOSIS — N390 Urinary tract infection, site not specified: Secondary | ICD-10-CM | POA: Diagnosis not present

## 2015-05-26 HISTORY — DX: Urinary tract infection, site not specified: N39.0

## 2015-05-26 LAB — POCT URINALYSIS DIP (DEVICE)
BILIRUBIN URINE: NEGATIVE
GLUCOSE, UA: 100 mg/dL — AB
KETONES UR: NEGATIVE mg/dL
Nitrite: NEGATIVE
Protein, ur: 30 mg/dL — AB
SPECIFIC GRAVITY, URINE: 1.015 (ref 1.005–1.030)
UROBILINOGEN UA: 0.2 mg/dL (ref 0.0–1.0)
pH: 7.5 (ref 5.0–8.0)

## 2015-05-26 LAB — POCT PREGNANCY, URINE: Preg Test, Ur: NEGATIVE

## 2015-05-26 MED ORDER — PHENAZOPYRIDINE HCL 200 MG PO TABS: 200.0000 mg | ORAL_TABLET | Freq: Three times a day (TID) | ORAL | Status: DC

## 2015-05-26 MED ORDER — CEPHALEXIN 500 MG PO CAPS: 500.0000 mg | ORAL_CAPSULE | Freq: Four times a day (QID) | ORAL | Status: DC

## 2015-05-26 NOTE — Discharge Instructions (Signed)
You have a urinary tract infection. Take the Keflex and Pyridium as prescribed. Follow-up as needed.

## 2015-05-26 NOTE — ED Notes (Signed)
C/O dysuria, urinary urgency, and hematuria since 1400 today.  Has some slight mid low back pain and slight suprapubic pain.  Denies fevers.

## 2015-05-26 NOTE — ED Provider Notes (Signed)
CSN: [id number]     Arrival date & time 05/26/15  1708 History   First MD Initiated Contact with Patient 05/26/15 1727     Chief Complaint  Patient presents with  . Urinary Tract Infection   (Consider location/radiation/quality/duration/timing/severity/associated sxs/prior Treatment) HPI  She is a 19 year old woman here for evaluation of dysuria. She states her symptoms started this afternoon at 2:00. She reports feeling the urge to urinate, but only getting a small amount of urine. She also reports some gross hematuria. She describes some mild suprapubic and low back pain. No flank pain. She did have a brief episode of nausea, but this has resolved. No fevers or chills. No vomiting. She states she did have a vaginal odor last week, but this has resolved.  Past Medical History  Diagnosis Date  . Syncope 2009  . UTI (lower urinary tract infection)    History reviewed. No pertinent past surgical history. No family history on file. Social History  Substance Use Topics  . Smoking status: Never Smoker   . Smokeless tobacco: Never Used  . Alcohol Use: No   OB History    No data available     Review of Systems As in history of present illness Allergies  Review of patient's allergies indicates no known allergies.  Home Medications   Prior to Admission medications   Medication Sig Start Date End Date Taking? Authorizing Provider  norethindrone-ethinyl estradiol (JUNEL FE 1/20) 1-20 MG-MCG tablet Take 1 tablet by mouth daily. 10/19/14  Yes Amy Michelle Nasuti, MD  cephALEXin (KEFLEX) 500 MG capsule Take 1 capsule (500 mg total) by mouth 4 (four) times daily. 05/26/15   Charm Rings, MD  phenazopyridine (PYRIDIUM) 200 MG tablet Take 1 tablet (200 mg total) by mouth 3 (three) times daily. 05/26/15   Charm Rings, MD   Meds Ordered and Administered this Visit  Medications - No data to display  BP 111/76 mmHg  Pulse 87  Temp(Src) 97.7 F (36.5 C) (Oral)  Resp 18  SpO2 99%  LMP 04/27/2015  (Exact Date) No data found.   Physical Exam  Constitutional: She is oriented to person, place, and time. She appears well-developed and well-nourished. No distress.  Cardiovascular: Normal rate.   Pulmonary/Chest: Effort normal.  Abdominal: Soft. Bowel sounds are normal. She exhibits no distension. There is tenderness (mild in suprapubic). There is no rebound and no guarding.  No CVA tenderness  Neurological: She is alert and oriented to person, place, and time.    ED Course  Procedures (including critical care time)  Labs Review Labs Reviewed  POCT URINALYSIS DIP (DEVICE) - Abnormal; Notable for the following:    Glucose, UA 100 (*)    Hgb urine dipstick LARGE (*)    Protein, ur 30 (*)    Leukocytes, UA LARGE (*)    All other components within normal limits  URINE CULTURE  POCT PREGNANCY, URINE    Imaging Review No results found.    MDM   1. UTI (lower urinary tract infection)    Treat with Keflex and Pyridium. Follow-up as needed.    Charm Rings, MD 05/26/15 [phone]

## 2015-05-27 LAB — URINE CULTURE: Culture: NO GROWTH

## 2015-05-28 NOTE — ED Notes (Signed)
Final report of urine C&S negative 

## 2015-07-08 ENCOUNTER — Telehealth: Payer: Self-pay | Admitting: Family Medicine

## 2015-07-08 NOTE — Telephone Encounter (Signed)
TELEPHONE ADVICE RECORD [facility]  Patient Name: Heidi Orr  DOB: [birth year]    Initial Comment Caller States daughter gets sick when she takes her birth control pills. now she has started to spot before her period starts.    Nurse Assessment  Nurse: Odis LusterBowers, RN, Bjorn Loserhonda Date/Time Lamount Cohen(Eastern Time): 07/08/2015 3:04:34 PM  Confirm and document reason for call. If symptomatic, describe symptoms. ---Caller States daughter gets sick (nauseated) when she takes her birth control pills. now she has started to spot before her period starts, usually 1-2 weeks before she is supposed to get her period. Reports that she has been taking BC pills for several years. Last yearly appt was almost 1 year ago. Caller is bleeding now, period is scheduled to start in the next 1-2 weeks.  Has the patient traveled out of the country within the last 30 days? ---No  Does the patient have any new or worsening symptoms? ---Yes  Will a triage be completed? ---Yes  Related visit to physician within the last 2 weeks? ---No  Does the PT have any chronic conditions? (i.e. diabetes, asthma, etc.) ---No  Did the patient indicate they were pregnant? ---No     Guidelines    Guideline Title Affirmed Question Affirmed Notes  Vaginal Bleeding - Abnormal [1] Bleeding or spotting between regular periods AND [2] occurs more than two cycles (2 months) this past year    Final Disposition User   See PCP within 2 Blima DessertWeeks Bowers, RN, Bjorn Loserhonda    Comments  [facility]t [facility] location on Friday 07/12/15 @ 9am with Dr. Ermalene SearingBedsole.   Referrals  REFERRED TO PCP OFFICE   Disagree/Comply: Comply

## 2015-07-12 ENCOUNTER — Encounter (INDEPENDENT_AMBULATORY_CARE_PROVIDER_SITE_OTHER): Payer: Self-pay

## 2015-07-12 ENCOUNTER — Encounter: Payer: Self-pay | Admitting: Family Medicine

## 2015-07-12 ENCOUNTER — Ambulatory Visit (INDEPENDENT_AMBULATORY_CARE_PROVIDER_SITE_OTHER): Payer: 59 | Admitting: Family Medicine

## 2015-07-12 VITALS — BP 116/71 | HR 67 | Temp 97.7°F | Ht 63.0 in | Wt 107.5 lb

## 2015-07-12 DIAGNOSIS — N938 Other specified abnormal uterine and vaginal bleeding: Secondary | ICD-10-CM

## 2015-07-12 DIAGNOSIS — R110 Nausea: Secondary | ICD-10-CM

## 2015-07-12 LAB — POCT URINE PREGNANCY: PREG TEST UR: NEGATIVE

## 2015-07-12 NOTE — Assessment & Plan Note (Addendum)
Neg preg test. Likely due to OCP and pt change in weight.  Discussed other  Birth control options such as depo. She will  Stop OCPs and start depo once menses starts. For now change to lower estrogen option given nausea bothers her more that spotting.

## 2015-07-12 NOTE — Progress Notes (Signed)
Pre visit review using our clinic review tool, if applicable. No additional management support is needed unless otherwise documented below in the visit note. 

## 2015-07-12 NOTE — Patient Instructions (Addendum)
Return when menses occurs for initiation of Depo shot.   Nurse only visit. Continue birth control orally until then. Call if with change of birth control, nausea is not improving or other SE.

## 2015-07-12 NOTE — Assessment & Plan Note (Signed)
Most bothersome of SE from OCPs.

## 2015-07-12 NOTE — Progress Notes (Signed)
   Subjective:    Patient ID: Heidi MassonKatie L Orr, female    DOB: 05-29-1996, 19 y.o.   MRN: [id number]  HPI  19 year old female presents with new onset abnormal vaginal bleeding in last 3 months.  She has been having spotting for  2 day course about 1 week   prior to her menses.  More recently in last 2 months when she takes OCP she has odd taste in mouth and causes nausea, even if she eats or drinks with it. Nausea lasts 3-4 hours.   Menses lasts 5 days, normal to light in amount.  Normal cramping with menses.  She has gained some weight as we have intended. No change in exercise. Body mass index is 19.05 kg/(m^2).  Wt Readings from Last 3 Encounters:  07/12/15 107 lb 8 oz (48.762 kg)  10/05/14 104 lb (47.174 kg)  06/01/14 102 lb 12 oz (46.607 kg) (6 %*, Z = -1.59)   * Growth percentiles are based on CDC 2-20 Years data.   She has started sexual activity,  Occ uses a condom. Has had neg preg test at home.    She is currrently on OCPs JUnelFE, she has been on it since 2013.   Last menses was 10/21   Review of Systems  Constitutional: Negative for fever and fatigue.  HENT: Negative for ear pain.   Eyes: Negative for pain.  Respiratory: Negative for chest tightness and shortness of breath.   Cardiovascular: Negative for chest pain, palpitations and leg swelling.  Gastrointestinal: Negative for abdominal pain.  Genitourinary: Negative for dysuria.       Objective:   Physical Exam  Constitutional: Vital signs are normal. She appears well-developed and well-nourished. She is cooperative.  Non-toxic appearance. She does not appear ill. No distress.  HENT:  Head: Normocephalic.  Right Ear: Hearing, tympanic membrane, external ear and ear canal normal. Tympanic membrane is not erythematous, not retracted and not bulging.  Left Ear: Hearing, tympanic membrane, external ear and ear canal normal. Tympanic membrane is not erythematous, not retracted and not bulging.  Nose: No  mucosal edema or rhinorrhea. Right sinus exhibits no maxillary sinus tenderness and no frontal sinus tenderness. Left sinus exhibits no maxillary sinus tenderness and no frontal sinus tenderness.  Mouth/Throat: Uvula is midline, oropharynx is clear and moist and mucous membranes are normal.  Eyes: Conjunctivae, EOM and lids are normal. Pupils are equal, round, and reactive to light. Lids are everted and swept, no foreign bodies found.  Neck: Trachea normal and normal range of motion. Neck supple. Carotid bruit is not present. No thyroid mass and no thyromegaly present.  Cardiovascular: Normal rate, regular rhythm, S1 normal, S2 normal, normal heart sounds, intact distal pulses and normal pulses.  Exam reveals no gallop and no friction rub.   No murmur heard. Pulmonary/Chest: Effort normal and breath sounds normal. No tachypnea. No respiratory distress. She has no decreased breath sounds. She has no wheezes. She has no rhonchi. She has no rales.  Abdominal: Soft. Normal appearance and bowel sounds are normal. There is no tenderness.  Neurological: She is alert.  Skin: Skin is warm, dry and intact. No rash noted.  Psychiatric: Her speech is normal and behavior is normal. Judgment and thought content normal. Her mood appears not anxious. Cognition and memory are normal. She does not exhibit a depressed mood.          Assessment & Plan:

## 2015-09-01 NOTE — L&D Delivery Note (Signed)
20 y.o. G1P0 at 5822w6d delivered a viable female infant in cephalic, LOA position. no nuchal cord. right anterior shoulder delivered with ease. 60 sec delayed cord clamping. Cord clamped x2 and cut. Placenta delivered spontaneously intact, with 3VC. Fundus firm on exam with massage and pitocin. Good hemostasis noted.  Laceration: left/right labial abrasions achieved spontaneous hemostasis Suture:  3-0 Vicryl EBL: 100 Good hemostasis noted.  Mom and baby recovering in LDR.    Apgars: 7,9 Weight: pending, skin to skin, see delivery summary  Cord blood obtained: Yes   WALLACE, NOAH I, DO PGY-3 08/11/2016, 9:45 AM    OB FELLOW DELIVERY ATTESTATION  I was gloved and present for the delivery in its entirety, and I agree with the above resident's note.    Jen MowElizabeth Buddy Loeffelholz, DO OB Fellow

## 2015-10-01 ENCOUNTER — Other Ambulatory Visit: Payer: Self-pay | Admitting: Family Medicine

## 2015-10-01 NOTE — Telephone Encounter (Signed)
Last office visit 07/12/2015.  Junel is not on current medication list.  Refill?

## 2015-10-10 ENCOUNTER — Other Ambulatory Visit (HOSPITAL_COMMUNITY)
Admission: RE | Admit: 2015-10-10 | Discharge: 2015-10-10 | Disposition: A | Discharge status: Home / Self Care | Payer: BLUE CROSS/BLUE SHIELD | Source: Ambulatory Visit | Attending: Family Medicine | Admitting: Family Medicine

## 2015-10-10 ENCOUNTER — Ambulatory Visit (INDEPENDENT_AMBULATORY_CARE_PROVIDER_SITE_OTHER): Payer: BLUE CROSS/BLUE SHIELD | Admitting: Family Medicine

## 2015-10-10 ENCOUNTER — Encounter: Payer: Self-pay | Admitting: Family Medicine

## 2015-10-10 VITALS — BP 107/68 | HR 68 | Temp 98.3°F | Ht 63.5 in | Wt 104.0 lb

## 2015-10-10 DIAGNOSIS — Z0000 Encounter for general adult medical examination without abnormal findings: Secondary | ICD-10-CM | POA: Diagnosis not present

## 2015-10-10 DIAGNOSIS — Z124 Encounter for screening for malignant neoplasm of cervix: Secondary | ICD-10-CM

## 2015-10-10 DIAGNOSIS — Z1151 Encounter for screening for human papillomavirus (HPV): Secondary | ICD-10-CM | POA: Insufficient documentation

## 2015-10-10 DIAGNOSIS — Z01411 Encounter for gynecological examination (general) (routine) with abnormal findings: Secondary | ICD-10-CM | POA: Insufficient documentation

## 2015-10-10 DIAGNOSIS — Z23 Encounter for immunization: Secondary | ICD-10-CM | POA: Diagnosis not present

## 2015-10-10 DIAGNOSIS — R87810 Cervical high risk human papillomavirus (HPV) DNA test positive: Secondary | ICD-10-CM | POA: Diagnosis not present

## 2015-10-10 NOTE — Progress Notes (Signed)
Pre visit review using our clinic review tool, if applicable. No additional management support is needed unless otherwise documented below in the visit note. 

## 2015-10-10 NOTE — Progress Notes (Signed)
The patient is here for annual wellness exam and preventative care.   She is on OCPs for birth control.Heidi Orr. No current nausea. Regular menses.  May consider changing to Depo  Trying to eat more regularly and increasing portion size. No regular exercise. Limited veggies, increased fruit, occ fast food  Wt Readings from Last 3 Encounters:  10/10/15 104 lb (47.174 kg)  07/12/15 107 lb 8 oz (48.762 kg)  10/05/14 104 lb (47.174 kg)   Body mass index is 18.13 kg/(m^2).       Social History  . Marital Status: Single    Spouse Name: N/A    Number of Children: N/A  . Years of Education: N/A   Occupational History  . Student    Social History Main Topics  . Smoking status: Never Smoker   . Smokeless tobacco: None  . Alcohol Use: YES  . Drug Use: No  . Sexually Active: No   Other Topics Concern  . None   Social History Narrative  . None     Review of Systems  Constitutional: Negative for fever, fatigue and unexpected weight change.  HENT: Negative for ear pain, congestion, sore throat, sneezing, trouble swallowing and sinus pressure.  Eyes: Negative for pain and itching.  Respiratory: Negative for cough, chest tightness, shortness of breath and wheezing.  Cardiovascular: Negative for chest pain, palpitations and leg swelling.  Gastrointestinal: Negative for nausea, abdominal pain, diarrhea, constipation and blood in stool.  Genitourinary: Negative for dysuria, hematuria, vaginal discharge, difficulty urinating and menstrual problem.  Skin: Negative for rash.  Neurological: Negative for syncope, weakness, light-headedness, numbness and headaches.  Psychiatric/Behavioral: Negative for confusion and dysphoric mood. The patient is not nervous/anxious.      Objective:  Physical Exam  Constitutional: Vital signs are normal. She appears well-developed and  well-nourished. She is cooperative. Non-toxic appearance. She does not appear ill. No distress.  HENT:  Head: Normocephalic.  Right Ear: Hearing, tympanic membrane, external ear and ear canal normal.  Left Ear: Hearing, tympanic membrane, external ear and ear canal normal.  Nose: Nose normal.  Eyes: Conjunctivae, EOM and lids are normal. Pupils are equal, round, and reactive to light. No foreign bodies found.  Neck: Trachea normal and normal range of motion. Neck supple. Carotid bruit is not present. No mass and no thyromegaly present.  Cardiovascular: Normal rate, regular rhythm, S1 normal, S2 normal, normal heart sounds and intact distal pulses. Exam reveals no gallop.  No murmur heard. Pulmonary/Chest: Effort normal and breath sounds normal. No respiratory distress. She has no wheezes. She has no rhonchi. She has no rales.  Abdominal: Soft. Normal appearance and bowel sounds are normal. She exhibits no distension, no fluid wave, no abdominal bruit and no mass. There is no hepatosplenomegaly. There is no tenderness. There is no rebound, no guarding and no CVA tenderness. No hernia.  Lymphadenopathy:   She has no cervical adenopathy.   She has no axillary adenopathy.  Neurological: She is alert. She has normal strength. No cranial nerve deficit or sensory deficit.  Skin: Skin is warm, dry and intact. No rash noted.  Psychiatric: Her speech is normal and behavior is normal. Judgment normal. Her mood appears not anxious. Cognition and memory are normal. She does not exhibit a depressed mood.  Normal introitus for age, no external lesions, no vaginal discharge, mucosa pink and moist, no vaginal or cervical lesions, no vaginal atrophy, no friaility or hemorrhage, normal uterus size and position, no adnexal masses or tenderness.  Chaperoned  exam.        Assessment & Plan:  The patient's preventative maintenance and recommended screening tests for an annual wellness exam were  reviewed in full today. Brought up to date unless services declined.  Counselled on the importance of diet, exercise, and its role in overall health and mortality. The patient's FH and SH was reviewed, including their home life, tobacco status, and drug and alcohol status.   Uptodate with HPV, Td, meningitis, given flu PAP/DVE: due No need for std testing, no sexual activity. Nonsmoker  No drug use.  Minimal beer.. 2 per week.

## 2015-10-10 NOTE — Addendum Note (Signed)
Addended by: Damita Lack on: 10/10/2015 11:29 AM   Modules accepted: Orders

## 2015-10-10 NOTE — Patient Instructions (Signed)
Work on increasing exercise to build muscle.  Increase protein and portion size as able.

## 2015-10-11 LAB — CYTOLOGY - PAP

## 2016-01-01 ENCOUNTER — Encounter: Payer: Self-pay | Admitting: Family Medicine

## 2016-01-01 ENCOUNTER — Ambulatory Visit (INDEPENDENT_AMBULATORY_CARE_PROVIDER_SITE_OTHER): Payer: Medicaid Other | Admitting: Family Medicine

## 2016-01-01 VITALS — BP 106/66 | HR 73 | Wt 104.0 lb

## 2016-01-01 DIAGNOSIS — N912 Amenorrhea, unspecified: Secondary | ICD-10-CM

## 2016-01-01 LAB — POCT URINE PREGNANCY: Preg Test, Ur: POSITIVE — AB

## 2016-01-01 NOTE — Assessment & Plan Note (Signed)
Upreg positive. Check serum BHCG quant. Info on early pregnancy given.

## 2016-01-01 NOTE — Progress Notes (Signed)
Pre visit review using our clinic review tool, if applicable. No additional management support is needed unless otherwise documented below in the visit note. 

## 2016-01-01 NOTE — Patient Instructions (Signed)
Start on prenatal vitamin daily.  No alcohol, no smoking.  Work on [employer]healthy eating.  Decide on a obstetrician.  Calll to set up first appointment. Stop at lab on way out.

## 2016-01-01 NOTE — Progress Notes (Signed)
   Subjective:    Patient ID: Heidi MassonKatie L Orr, female    DOB: 1996/04/23, 20 y.o.   MRN: [id number]  HPI  20 year old female presents to discuss possible pregnancy.  She reports she had irregular menses ( February was last menses) and she has been sexually active. She has not been taking her JUnel.   She has had a negative  preg test 1 week ago, second pregnancy test  Positive was last week.  2 days ago third test was positive as well.   Low abdominal pain at times, no fatigue, occ nausea. Social History /Family History/Past Medical History reviewed and updated if needed.   Review of Systems  Constitutional: Negative for fever and fatigue.  HENT: Negative for ear pain.   Eyes: Negative for pain.  Respiratory: Negative for chest tightness and shortness of breath.   Cardiovascular: Negative for chest pain, palpitations and leg swelling.  Gastrointestinal: Negative for abdominal pain.  Genitourinary: Negative for dysuria.       Objective:   Physical Exam  Constitutional: Vital signs are normal. She appears well-developed and well-nourished. She is cooperative.  Non-toxic appearance. She does not appear ill. No distress.  HENT:  Head: Normocephalic.  Right Ear: Hearing, tympanic membrane, external ear and ear canal normal. Tympanic membrane is not erythematous, not retracted and not bulging.  Left Ear: Hearing, tympanic membrane, external ear and ear canal normal. Tympanic membrane is not erythematous, not retracted and not bulging.  Nose: No mucosal edema or rhinorrhea. Right sinus exhibits no maxillary sinus tenderness and no frontal sinus tenderness. Left sinus exhibits no maxillary sinus tenderness and no frontal sinus tenderness.  Mouth/Throat: Uvula is midline, oropharynx is clear and moist and mucous membranes are normal.  Eyes: Conjunctivae, EOM and lids are normal. Pupils are equal, round, and reactive to light. Lids are everted and swept, no foreign bodies found.  Neck:  Trachea normal and normal range of motion. Neck supple. Carotid bruit is not present. No thyroid mass and no thyromegaly present.  Cardiovascular: Normal rate, regular rhythm, S1 normal, S2 normal, normal heart sounds, intact distal pulses and normal pulses.  Exam reveals no gallop and no friction rub.   No murmur heard. Pulmonary/Chest: Effort normal and breath sounds normal. No tachypnea. No respiratory distress. She has no decreased breath sounds. She has no wheezes. She has no rhonchi. She has no rales.  Abdominal: Soft. Normal appearance and bowel sounds are normal. There is no tenderness.  Neurological: She is alert.  Skin: Skin is warm, dry and intact. No rash noted.  Psychiatric: Her speech is normal and behavior is normal. Judgment and thought content normal. Her mood appears not anxious. Cognition and memory are normal. She does not exhibit a depressed mood.          Assessment & Plan:

## 2016-01-04 LAB — BETA HCG QUANT (REF LAB): Beta hCG, Tumor Marker: 112085 m[IU]/mL — ABNORMAL HIGH (ref <5.0)

## 2016-01-20 ENCOUNTER — Ambulatory Visit (HOSPITAL_COMMUNITY)
Admission: EM | Admit: 2016-01-20 | Discharge: 2016-01-20 | Disposition: A | Discharge status: Home / Self Care | Payer: BLUE CROSS/BLUE SHIELD | Attending: Family Medicine | Admitting: Family Medicine

## 2016-01-20 ENCOUNTER — Encounter (HOSPITAL_COMMUNITY): Payer: Self-pay | Admitting: Emergency Medicine

## 2016-01-20 DIAGNOSIS — Z331 Pregnant state, incidental: Secondary | ICD-10-CM | POA: Diagnosis not present

## 2016-01-20 DIAGNOSIS — Z3201 Encounter for pregnancy test, result positive: Secondary | ICD-10-CM

## 2016-01-20 DIAGNOSIS — R81 Glycosuria: Secondary | ICD-10-CM | POA: Diagnosis not present

## 2016-01-20 DIAGNOSIS — N390 Urinary tract infection, site not specified: Secondary | ICD-10-CM | POA: Diagnosis not present

## 2016-01-20 DIAGNOSIS — O2341 Unspecified infection of urinary tract in pregnancy, first trimester: Secondary | ICD-10-CM | POA: Diagnosis not present

## 2016-01-20 DIAGNOSIS — Z3490 Encounter for supervision of normal pregnancy, unspecified, unspecified trimester: Secondary | ICD-10-CM

## 2016-01-20 LAB — POCT URINALYSIS DIP (DEVICE)
BILIRUBIN URINE: NEGATIVE
GLUCOSE, UA: 500 mg/dL — AB
KETONES UR: NEGATIVE mg/dL
Nitrite: NEGATIVE
PROTEIN: 100 mg/dL — AB
SPECIFIC GRAVITY, URINE: 1.02 (ref 1.005–1.030)
Urobilinogen, UA: 0.2 mg/dL (ref 0.0–1.0)
pH: 7 (ref 5.0–8.0)

## 2016-01-20 LAB — POCT I-STAT, CHEM 8
BUN: 10 mg/dL (ref 6–20)
CALCIUM ION: 1.1 mmol/L — AB (ref 1.12–1.23)
Chloride: 102 mmol/L (ref 101–111)
Creatinine, Ser: 0.5 mg/dL (ref 0.44–1.00)
Glucose, Bld: 89 mg/dL (ref 65–99)
HEMATOCRIT: 37 % (ref 36.0–46.0)
Hemoglobin: 12.6 g/dL (ref 12.0–15.0)
Potassium: 3.7 mmol/L (ref 3.5–5.1)
SODIUM: 137 mmol/L (ref 135–145)
TCO2: 22 mmol/L (ref 0–100)

## 2016-01-20 LAB — POCT PREGNANCY, URINE: PREG TEST UR: POSITIVE — AB

## 2016-01-20 MED ORDER — CEPHALEXIN 500 MG PO CAPS: 500.0000 mg | ORAL_CAPSULE | Freq: Four times a day (QID) | ORAL | Status: DC

## 2016-01-20 NOTE — ED Notes (Signed)
uti symptoms : burning with urination.  Unsure when lmp was reports she was on birth control.  Reports 10-[redacted] weeks pregnant.

## 2016-01-20 NOTE — ED Provider Notes (Signed)
CSN: [id number]     Arrival date & time 01/20/16  1707 History   First MD Initiated Contact with Patient 01/20/16 1721     Chief Complaint  Patient presents with  . Urinary Tract Infection   (Consider location/radiation/quality/duration/timing/severity/associated sxs/prior Treatment) HPI Comments: 20 year old female complaining of a 24-hour history of urinary urgency, some frequency and dysuria during and after voiding. Has been having some nausea she believes is due to her pregnancy but no vomiting. She states she is an estimated 10-[redacted] weeks pregnant. Denies abdominal pain or vaginal bleeding.   Past Medical History  Diagnosis Date  . Syncope 2009  . UTI (lower urinary tract infection)    History reviewed. No pertinent past surgical history. No family history on file. Social History  Substance Use Topics  . Smoking status: Never Smoker   . Smokeless tobacco: Never Used  . Alcohol Use: 0.0 oz/week    0 Standard drinks or equivalent per week     Comment: very little   OB History    Gravida Para Term Preterm AB TAB SAB Ectopic Multiple Living   1              Review of Systems  Constitutional: Negative for fever, activity change and fatigue.  HENT: Negative.   Respiratory: Negative.   Genitourinary: Positive for dysuria and urgency. Negative for vaginal bleeding, vaginal discharge and pelvic pain.       See history of present illness  Musculoskeletal: Negative.   Neurological: Negative.     Allergies  Review of patient's allergies indicates no known allergies.  Home Medications   Prior to Admission medications   Medication Sig Start Date End Date Taking? Authorizing Provider  cephALEXin (KEFLEX) 500 MG capsule Take 1 capsule (500 mg total) by mouth 4 (four) times daily. 01/20/16   Hayden Rasmussen, NP   Meds Ordered and Administered this Visit  Medications - No data to display  BP 114/73 mmHg  Pulse 116  Temp(Src) 98 F (36.7 C) (Oral)  Resp 12  SpO2 100%  LMP  09/15/2015 No data found.   Physical Exam  Constitutional: She is oriented to person, place, and time. She appears well-developed and well-nourished. No distress.  Eyes: EOM are normal.  Neck: Normal range of motion. Neck supple.  Cardiovascular: Normal rate.   Pulmonary/Chest: Effort normal. No respiratory distress.  Musculoskeletal: Normal range of motion. She exhibits no edema.  Neurological: She is alert and oriented to person, place, and time. She exhibits normal muscle tone.  Skin: Skin is warm and dry.  Psychiatric: She has a normal mood and affect.  Nursing note and vitals reviewed.   ED Course  Procedures (including critical care time)  Labs Review Labs Reviewed  POCT URINALYSIS DIP (DEVICE) - Abnormal; Notable for the following:    Glucose, UA 500 (*)    Hgb urine dipstick SMALL (*)    Protein, ur 100 (*)    Leukocytes, UA TRACE (*)    All other components within normal limits  POCT PREGNANCY, URINE - Abnormal; Notable for the following:    Preg Test, Ur POSITIVE (*)    All other components within normal limits  POCT I-STAT, CHEM 8 - Abnormal; Notable for the following:    Calcium, Ion 1.10 (*)    All other components within normal limits  URINE CULTURE    Imaging Review No results found.   Visual Acuity Review  Right Eye Distance:   Left Eye Distance:   Bilateral Distance:  Right Eye Near:   Left Eye Near:    Bilateral Near:         MDM   1. UTI (lower urinary tract infection)   2. Glycosuria with normal serum glucose   3. Pregnancy    Meds ordered this encounter  Medications  . cephALEXin (KEFLEX) 500 MG capsule    Sig: Take 1 capsule (500 mg total) by mouth 4 (four) times daily.    Dispense:  28 capsule    Refill:  0    Order Specific Question:  Supervising Provider    Answer:  KINDL, JAMES D [[id number]]Linna Hoff[[id number]]   Drink plenty of fluids stay well-hydrated. Follow-up with your gynecologist/to be.    Hayden Rasmussenavid Natassja Ollis, NP 01/20/16 1824

## 2016-01-20 NOTE — Discharge Instructions (Signed)

## 2016-01-22 LAB — URINE CULTURE: SPECIAL REQUESTS: NORMAL

## 2016-01-25 ENCOUNTER — Telehealth (HOSPITAL_COMMUNITY): Payer: Self-pay | Admitting: Emergency Medicine

## 2016-01-25 NOTE — ED Notes (Signed)
Called pt and notified of recent lab results from visit 5/22 Pt ID'd properly... Reports feeling "a whole lot better"... Has 2 more days of antibiotics   Per Dr. Dayton ScrapeMurray,  Please let patient know that urine culture was positive for E coli, [facility] Recheck as needed. LM  Adv pt if sx are not getting better to return  Pt verb understanding

## 2016-02-12 ENCOUNTER — Inpatient Hospital Stay (HOSPITAL_COMMUNITY)
Admission: AD | Admit: 2016-02-12 | Discharge: 2016-02-12 | Disposition: A | Discharge status: Home / Self Care | Payer: BLUE CROSS/BLUE SHIELD | Source: Ambulatory Visit | Attending: Family Medicine | Admitting: Family Medicine

## 2016-02-12 ENCOUNTER — Encounter (HOSPITAL_COMMUNITY): Payer: Self-pay

## 2016-02-12 DIAGNOSIS — Z87440 Personal history of urinary (tract) infections: Secondary | ICD-10-CM | POA: Insufficient documentation

## 2016-02-12 DIAGNOSIS — Z3A14 14 weeks gestation of pregnancy: Secondary | ICD-10-CM | POA: Insufficient documentation

## 2016-02-12 DIAGNOSIS — O26892 Other specified pregnancy related conditions, second trimester: Secondary | ICD-10-CM | POA: Diagnosis not present

## 2016-02-12 DIAGNOSIS — R55 Syncope and collapse: Secondary | ICD-10-CM

## 2016-02-12 DIAGNOSIS — Z3492 Encounter for supervision of normal pregnancy, unspecified, second trimester: Secondary | ICD-10-CM

## 2016-02-12 DIAGNOSIS — R404 Transient alteration of awareness: Secondary | ICD-10-CM | POA: Diagnosis not present

## 2016-02-12 DIAGNOSIS — O9989 Other specified diseases and conditions complicating pregnancy, childbirth and the puerperium: Secondary | ICD-10-CM | POA: Diagnosis not present

## 2016-02-12 DIAGNOSIS — R531 Weakness: Secondary | ICD-10-CM | POA: Diagnosis not present

## 2016-02-12 LAB — URINALYSIS, ROUTINE W REFLEX MICROSCOPIC
Bilirubin Urine: NEGATIVE
GLUCOSE, UA: 250 mg/dL — AB
HGB URINE DIPSTICK: NEGATIVE
Ketones, ur: NEGATIVE mg/dL
Leukocytes, UA: NEGATIVE
Nitrite: NEGATIVE
PH: 7 (ref 5.0–8.0)
Protein, ur: NEGATIVE mg/dL
SPECIFIC GRAVITY, URINE: 1.01 (ref 1.005–1.030)

## 2016-02-12 LAB — CBC
HEMATOCRIT: 35.2 % — AB (ref 36.0–46.0)
HEMOGLOBIN: 11.8 g/dL — AB (ref 12.0–15.0)
MCH: 28.6 pg (ref 26.0–34.0)
MCHC: 33.5 g/dL (ref 30.0–36.0)
MCV: 85.2 fL (ref 78.0–100.0)
PLATELETS: 194 10*3/uL (ref 150–400)
RBC: 4.13 MIL/uL (ref 3.87–5.11)
RDW: 13.8 % (ref 11.5–15.5)
WBC: 10.8 10*3/uL — ABNORMAL HIGH (ref 4.0–10.5)

## 2016-02-12 LAB — GLUCOSE, CAPILLARY: Glucose-Capillary: 119 mg/dL — ABNORMAL HIGH (ref 65–99)

## 2016-02-12 NOTE — MAU Provider Note (Signed)
History     CSN: [id number]  Arrival date and time: 02/12/16 1112   First Provider Initiated Contact with Patient 02/12/16 1135      Chief Complaint  Patient presents with  . Near Syncope   HPI  Heidi Orr is a 20 y.o. G1P0 at [redacted]w[redacted]d who presents s/p presyncopal episode. States she was standing at work when everything went black & she felt like she was going to pass out; pt immediately sat down & symptoms resolved. Dis not lose consciousness or hit head. Denies abdominal pain, vaginal bleeding, chest pain, SOB, or palpitations. Denies headache or dizziness. Had an episode like this as a teenager that was attributed to standing for too long.   OB History    Gravida Para Term Preterm AB TAB SAB Ectopic Multiple Living   1               Past Medical History  Diagnosis Date  . Syncope 2009  . UTI (lower urinary tract infection)   . Complication of anesthesia     High fever from nitrous     Past Surgical History  Procedure Laterality Date  . Tooth extraction      History reviewed. No pertinent family history.  Social History  Substance Use Topics  . Smoking status: Never Smoker   . Smokeless tobacco: Never Used  . Alcohol Use: 0.0 oz/week    0 Standard drinks or equivalent per week     Comment: very little, not while pregnant    Allergies: No Known Allergies  Prescriptions prior to admission  Medication Sig Dispense Refill Last Dose  . Prenatal Vit-Fe Fumarate-FA (PRENATAL MULTIVITAMIN) TABS tablet Take 1 tablet by mouth at bedtime.   02/11/2016 at Unknown time    Review of Systems  Constitutional: Negative.   Respiratory: Negative.   Cardiovascular: Negative.   Gastrointestinal: Negative.   Genitourinary: Negative.   Neurological: Negative.    Physical Exam   Blood pressure 116/61, pulse 84, temperature 98.4 F (36.9 C), temperature source Oral, resp. rate 16, height  (1.575 m), weight 107 lb (48.535 kg), last menstrual period  09/15/2015.  Orthostatic VS for the past 24 hrs:  BP- Lying Pulse- Lying BP- Sitting Pulse- Sitting BP- Standing at 0 minutes Pulse- Standing at 0 minutes  02/12/16 1134 120/55 mmHg 83 116/58 mmHg 91 116/66 mmHg 105   Physical Exam  Nursing note and vitals reviewed. Constitutional: She is oriented to person, place, and time. She appears well-developed and well-nourished. No distress.  HENT:  Head: Normocephalic and atraumatic.  Eyes: Conjunctivae are normal. Right eye exhibits no discharge. Left eye exhibits no discharge. No scleral icterus.  Neck: Normal range of motion.  Cardiovascular: Normal rate, regular rhythm and normal heart sounds.   No murmur heard. Respiratory: Effort normal and breath sounds normal. No respiratory distress. She has no wheezes.  GI: Soft. Bowel sounds are normal. There is no tenderness.  Neurological: She is alert and oriented to person, place, and time.  Skin: Skin is warm and dry. She is not diaphoretic.  Psychiatric: She has a normal mood and affect. Her behavior is normal. Judgment and thought content normal.    MAU Course  Procedures Results for orders placed or performed during the hospital encounter of 02/12/16 (from the past 24 hour(s))  Urinalysis, Routine w reflex microscopic (not at [facility])     Status: Abnormal   Collection Time: 02/12/16 11:20 AM  Result Value Ref Range   Color, Urine YELLOW  YELLOW   APPearance HAZY (A) CLEAR   Specific Gravity, Urine 1.010 1.005 - 1.030   pH 7.0 5.0 - 8.0   Glucose, UA 250 (A) NEGATIVE mg/dL   Hgb urine dipstick NEGATIVE NEGATIVE   Bilirubin Urine NEGATIVE NEGATIVE   Ketones, ur NEGATIVE NEGATIVE mg/dL   Protein, ur NEGATIVE NEGATIVE mg/dL   Nitrite NEGATIVE NEGATIVE   Leukocytes, UA NEGATIVE NEGATIVE  Glucose, capillary     Status: Abnormal   Collection Time: 02/12/16 11:33 AM  Result Value Ref Range   Glucose-Capillary 119 (H) 65 - 99 mg/dL  CBC     Status: Abnormal   Collection Time: 02/12/16 12:04  PM  Result Value Ref Range   WBC 10.8 (H) 4.0 - 10.5 K/uL   RBC 4.13 3.87 - 5.11 MIL/uL   Hemoglobin 11.8 (L) 12.0 - 15.0 g/dL   HCT 09.835.2 (L) 11.936.0 - 14.746.0 %   MCV 85.2 78.0 - 100.0 fL   MCH 28.6 26.0 - 34.0 pg   MCHC 33.5 30.0 - 36.0 g/dL   RDW 82.913.8 56.211.5 - 13.015.5 %   Platelets 194 150 - 400 K/uL    MDM FHT 165 by doppler Orthostatic VS WNL EKG -- reviewed by cardmaster, WNL CBG - 119 Hemoglobin - pregnancy normal Cardiac & neuro exam normal  Assessment and Plan  A: 1. Pre-syncope   2. Fetal heart tones present, second trimester     P: Discharge home Increase water intake Eat several small meals throughout the day to maintain blood sugar Keep OB appointment Discussed reasons to return Work restrictions note given to include frequent breaks   Heidi Orr 02/12/2016, 11:35 AM

## 2016-02-12 NOTE — MAU Note (Addendum)
17 wks was at work, started feeling light headed and flushed. Sat down and felt like she was going to pass out.  Has not yet been able to get in for prenatal care.  Came in EMS. BG - 72.  Had a sugary breakfast, no protein.

## 2016-02-12 NOTE — Discharge Instructions (Signed)

## 2016-03-04 ENCOUNTER — Encounter: Payer: Self-pay | Admitting: Family

## 2016-03-04 ENCOUNTER — Ambulatory Visit (INDEPENDENT_AMBULATORY_CARE_PROVIDER_SITE_OTHER): Payer: Medicaid Other | Admitting: Family

## 2016-03-04 VITALS — BP 114/47 | HR 72 | Wt 112.8 lb

## 2016-03-04 DIAGNOSIS — Z3492 Encounter for supervision of normal pregnancy, unspecified, second trimester: Secondary | ICD-10-CM | POA: Diagnosis not present

## 2016-03-04 DIAGNOSIS — Z3482 Encounter for supervision of other normal pregnancy, second trimester: Secondary | ICD-10-CM

## 2016-03-04 DIAGNOSIS — Z113 Encounter for screening for infections with a predominantly sexual mode of transmission: Secondary | ICD-10-CM | POA: Diagnosis present

## 2016-03-04 DIAGNOSIS — Z3689 Encounter for other specified antenatal screening: Secondary | ICD-10-CM

## 2016-03-04 DIAGNOSIS — Z3490 Encounter for supervision of normal pregnancy, unspecified, unspecified trimester: Secondary | ICD-10-CM | POA: Insufficient documentation

## 2016-03-04 LAB — POCT URINALYSIS DIP (DEVICE)
BILIRUBIN URINE: NEGATIVE
Glucose, UA: 100 mg/dL — AB
HGB URINE DIPSTICK: NEGATIVE
Ketones, ur: NEGATIVE mg/dL
Nitrite: NEGATIVE
PH: 7.5 (ref 5.0–8.0)
PROTEIN: NEGATIVE mg/dL
Specific Gravity, Urine: 1.02 (ref 1.005–1.030)
UROBILINOGEN UA: 0.2 mg/dL (ref 0.0–1.0)

## 2016-03-04 NOTE — Patient Instructions (Signed)

## 2016-03-04 NOTE — Progress Notes (Signed)
   Subjective:    Heidi MassonKatie L Dust is a G1P0 7383w0d being seen today for her first obstetrical visit.  No current problems identified.  Hx of DUB, irregular cycles.  Patient does intend to breast feed. Pregnancy history fully reviewed.  Patient reports no complaints.  Filed Vitals:   03/04/16 1013  BP: 114/47  Pulse: 72  Weight: 112 lb 12.8 oz (51.166 kg)    HISTORY: OB History  Gravida Para Term Preterm AB SAB TAB Ectopic Multiple Living  1             # Outcome Date GA Lbr Len/2nd Weight Sex Delivery Anes PTL Lv  1 Current              Past Medical History  Diagnosis Date  . Syncope 2009  . UTI (lower urinary tract infection)   . Complication of anesthesia     High fever from nitrous    Past Surgical History  Procedure Laterality Date  . Tooth extraction     History reviewed. No pertinent family history.   Exam   Filed Vitals:   03/04/16 1013  BP: 114/47  Pulse: 72   System: Breast:  No nipple retraction or dimpling, No nipple discharge or bleeding, No axillary or supraclavicular adenopathy, Normal to palpation without dominant masses   Skin: normal coloration and turgor, no rashes    Neurologic: negative   Extremities: normal strength, tone, and muscle mass   HEENT neck supple with midline trachea and thyroid without masses   Mouth/Teeth mucous membranes moist, pharynx normal without lesions   Neck supple and no masses   Cardiovascular: regular rate and rhythm, no murmurs or gallops   Respiratory:  appears well, vitals normal, no respiratory distress, acyanotic, normal RR, neck free of mass or lymphadenopathy, chest clear, no wheezing, crepitations, rhonchi, normal symmetric air entry   Abdomen: soft, non-tender; bowel sounds normal; no masses,  no organomegaly      Assessment:    Pregnancy: G1P0 Patient Active Problem List   Diagnosis Date Noted  . Supervision of normal pregnancy, antepartum 03/04/2016  . Amenorrhea 01/01/2016  . DUB (dysfunctional  uterine bleeding) 07/12/2015  . Nausea without vomiting 07/12/2015  . Abnormal weight loss 01/26/2014  . IBS (irritable bowel syndrome) 01/10/2014        Plan:     Initial labs drawn. Reviewed pap results from Feb 2017, negative with +HRHPV.  Explained implications of results. Prenatal vitamins. Problem list reviewed and updated. Genetic Screening discussed Quad Screen: ordered.  Ultrasound discussed; fetal survey: ordered.  Follow up in 4 weeks.    Heidi EdelsonKARIM, Ladashia Demarinis Orr 03/04/2016

## 2016-03-04 NOTE — Progress Notes (Signed)
New ob packet given   

## 2016-03-05 LAB — AFP, QUAD SCREEN
AFP: 79.3 ng/mL
Curr Gest Age: 17 weeks
HCG TOTAL: 51.17 [IU]/mL
INH: 197.5 pg/mL
INTERPRETATION-AFP: NEGATIVE
MOM FOR HCG: 1.4
MoM for AFP: 1.79
MoM for INH: 0.91
Open Spina bifida: NEGATIVE
TRI 18 SCR RISK EST: NEGATIVE
UE3 MOM: 1.26
UE3 VALUE: 1.31 ng/mL

## 2016-03-05 LAB — PRENATAL PROFILE (SOLSTAS)
ANTIBODY SCREEN: NEGATIVE
BASOS ABS: 0 {cells}/uL (ref 0–200)
Basophils Relative: 0 %
EOS PCT: 1 %
Eosinophils Absolute: 91 cells/uL (ref 15–500)
HCT: 34.6 % — ABNORMAL LOW (ref 35.0–45.0)
HEMOGLOBIN: 11.6 g/dL — AB (ref 11.7–15.5)
HIV 1&2 Ab, 4th Generation: NONREACTIVE
Hepatitis B Surface Ag: NEGATIVE
Lymphocytes Relative: 15 %
Lymphs Abs: 1365 cells/uL (ref 850–3900)
MCH: 29.4 pg (ref 27.0–33.0)
MCHC: 33.5 g/dL (ref 32.0–36.0)
MCV: 87.8 fL (ref 80.0–100.0)
MONOS PCT: 7 %
MPV: 9 fL (ref 7.5–12.5)
Monocytes Absolute: 637 cells/uL (ref 200–950)
NEUTROS ABS: 7007 {cells}/uL (ref 1500–7800)
Neutrophils Relative %: 77 %
Platelets: 193 10*3/uL (ref 140–400)
RBC: 3.94 MIL/uL (ref 3.80–5.10)
RDW: 14.4 % (ref 11.0–15.0)
RUBELLA: 1.69 {index} — AB (ref <0.90)
Rh Type: POSITIVE
WBC: 9.1 10*3/uL (ref 3.8–10.8)

## 2016-03-05 LAB — CULTURE, OB URINE: Colony Count: 2000

## 2016-03-05 LAB — GC/CHLAMYDIA PROBE AMP (~~LOC~~) NOT AT ARMC
Chlamydia: NEGATIVE
Neisseria Gonorrhea: NEGATIVE

## 2016-03-07 LAB — PAIN MGMT, PROFILE 6 CONF W/O MM, U
6 Acetylmorphine: NEGATIVE ng/mL (ref <10)
AMPHETAMINES: NEGATIVE ng/mL (ref <500)
Alcohol Metabolites: NEGATIVE ng/mL (ref <500)
BARBITURATES: NEGATIVE ng/mL (ref <300)
BENZODIAZEPINES: NEGATIVE ng/mL (ref <100)
CREATININE: 92.4 mg/dL (ref >=20.0)
Cocaine Metabolite: NEGATIVE ng/mL (ref <150)
MARIJUANA METABOLITE: NEGATIVE ng/mL (ref <20)
Methadone Metabolite: NEGATIVE ng/mL (ref <100)
OPIATES: NEGATIVE ng/mL (ref <100)
OXIDANT: NEGATIVE ug/mL (ref <200)
OXYCODONE: NEGATIVE ng/mL (ref <100)
Phencyclidine: NEGATIVE ng/mL (ref <25)
Please note:: 0
pH: 6.85 (ref 4.5–9.0)

## 2016-03-18 ENCOUNTER — Ambulatory Visit (HOSPITAL_COMMUNITY)
Admission: RE | Admit: 2016-03-18 | Discharge: 2016-03-18 | Disposition: A | Discharge status: Home / Self Care | Payer: BLUE CROSS/BLUE SHIELD | Source: Ambulatory Visit | Attending: Family | Admitting: Family

## 2016-03-18 ENCOUNTER — Other Ambulatory Visit: Payer: Self-pay | Admitting: Family

## 2016-03-18 DIAGNOSIS — Z36 Encounter for antenatal screening of mother: Secondary | ICD-10-CM | POA: Diagnosis not present

## 2016-03-18 DIAGNOSIS — Z3A18 18 weeks gestation of pregnancy: Secondary | ICD-10-CM | POA: Diagnosis not present

## 2016-03-18 DIAGNOSIS — Z3492 Encounter for supervision of normal pregnancy, unspecified, second trimester: Secondary | ICD-10-CM

## 2016-03-18 DIAGNOSIS — Z3482 Encounter for supervision of other normal pregnancy, second trimester: Secondary | ICD-10-CM

## 2016-03-18 DIAGNOSIS — Z3689 Encounter for other specified antenatal screening: Secondary | ICD-10-CM

## 2016-03-20 ENCOUNTER — Encounter: Payer: Self-pay | Admitting: Family

## 2016-03-20 NOTE — Addendum Note (Signed)
Encounter addended by: Marlis EdelsonWalidah N Karim, CNM on: 03/20/2016 11:43 AM<BR>     Documentation filed: Problem List

## 2016-04-06 ENCOUNTER — Ambulatory Visit (INDEPENDENT_AMBULATORY_CARE_PROVIDER_SITE_OTHER): Payer: BLUE CROSS/BLUE SHIELD | Admitting: Obstetrics & Gynecology

## 2016-04-06 VITALS — BP 124/69 | HR 79 | Wt 118.1 lb

## 2016-04-06 DIAGNOSIS — Z3482 Encounter for supervision of other normal pregnancy, second trimester: Secondary | ICD-10-CM

## 2016-04-06 NOTE — Progress Notes (Signed)
[facility]

## 2016-04-06 NOTE — Progress Notes (Signed)
Subjective:  Heidi MassonKatie L Jaskulski is a 20 y.o. G1P0 at 251w5d being seen today for ongoing prenatal care.  She is currently monitored for the following issues for this low-risk pregnancy and has IBS (irritable bowel syndrome); Abnormal weight loss; DUB (dysfunctional uterine bleeding); Nausea without vomiting; Amenorrhea; and Supervision of normal pregnancy, antepartum on her problem list.  Patient reports no complaints.  Contractions: Not present. Vag. Bleeding: None.  Movement: Present. Denies leaking of fluid.   The following portions of the patient's history were reviewed and updated as appropriate: allergies, current medications, past family history, past medical history, past social history, past surgical history and problem list. Problem list updated.  Objective:   Vitals:   04/06/16 0912  BP: 124/69  Pulse: 79  Weight: 118 lb 1.6 oz (53.6 kg)    Fetal Status: Fetal Heart Rate (bpm): 152   Movement: Present     General:  Alert, oriented and cooperative. Patient is in no acute distress.  Skin: Skin is warm and dry. No rash noted.   Cardiovascular: Normal heart rate noted  Respiratory: Normal respiratory effort, no problems with respiration noted  Abdomen: Soft, gravid, appropriate for gestational age. Pain/Pressure: Present     Pelvic:  Cervical exam deferred        Extremities: Normal range of motion.  Edema: None  Mental Status: Normal mood and affect. Normal behavior. Normal judgment and thought content.   Urinalysis:      Assessment and Plan:  Pregnancy: G1P0 at 251w5d  1. Supervision of normal pregnancy, antepartum, second trimester   Preterm labor symptoms and general obstetric precautions including but not limited to vaginal bleeding, contractions, leaking of fluid and fetal movement were reviewed in detail with the patient. Please refer to After Visit Summary for other counseling recommendations.  No Follow-up on file.   Allie BossierMyra C Jackson Coffield, MD

## 2016-04-16 ENCOUNTER — Encounter: Payer: Self-pay | Admitting: Family Medicine

## 2016-05-12 ENCOUNTER — Ambulatory Visit (INDEPENDENT_AMBULATORY_CARE_PROVIDER_SITE_OTHER): Payer: Medicaid Other | Admitting: Advanced Practice Midwife

## 2016-05-12 DIAGNOSIS — Z3482 Encounter for supervision of other normal pregnancy, second trimester: Secondary | ICD-10-CM

## 2016-05-12 DIAGNOSIS — Z3480 Encounter for supervision of other normal pregnancy, unspecified trimester: Secondary | ICD-10-CM

## 2016-05-12 DIAGNOSIS — Z23 Encounter for immunization: Secondary | ICD-10-CM | POA: Diagnosis not present

## 2016-05-12 LAB — CBC
HEMATOCRIT: 33.3 % — AB (ref 35.0–45.0)
Hemoglobin: 11 g/dL — ABNORMAL LOW (ref 11.7–15.5)
MCH: 29.8 pg (ref 27.0–33.0)
MCHC: 33 g/dL (ref 32.0–36.0)
MCV: 90.2 fL (ref 80.0–100.0)
MPV: 8.7 fL (ref 7.5–12.5)
PLATELETS: 235 10*3/uL (ref 140–400)
RBC: 3.69 MIL/uL — ABNORMAL LOW (ref 3.80–5.10)
RDW: 13.7 % (ref 11.0–15.0)
WBC: 10.7 10*3/uL (ref 3.8–10.8)

## 2016-05-12 LAB — POCT URINALYSIS DIP (DEVICE)
Bilirubin Urine: NEGATIVE
GLUCOSE, UA: 100 mg/dL — AB
HGB URINE DIPSTICK: NEGATIVE
Ketones, ur: NEGATIVE mg/dL
NITRITE: NEGATIVE
PROTEIN: NEGATIVE mg/dL
SPECIFIC GRAVITY, URINE: 1.015 (ref 1.005–1.030)
UROBILINOGEN UA: 0.2 mg/dL (ref 0.0–1.0)
pH: 7.5 (ref 5.0–8.0)

## 2016-05-12 MED ORDER — TETANUS-DIPHTH-ACELL PERTUSSIS 5-2.5-18.5 LF-MCG/0.5 IM SUSP
0.5000 mL | Freq: Once | INTRAMUSCULAR | Status: AC
  Administered 2016-05-12: 0.5 mL via INTRAMUSCULAR

## 2016-05-12 NOTE — Progress Notes (Signed)
   PRENATAL VISIT NOTE  Subjective:  Heidi MassonKatie L Orr is a 20 y.o. G1P0 at 6597w6d being seen today for ongoing prenatal care.  She is currently monitored for the following issues for this low-risk pregnancy and has IBS (irritable bowel syndrome); Abnormal weight loss; DUB (dysfunctional uterine bleeding); Nausea without vomiting; Amenorrhea; and Supervision of normal pregnancy, antepartum on her problem list.  Patient reports no complaints.   .  .   . Denies leaking of fluid.   The following portions of the patient's history were reviewed and updated as appropriate: allergies, current medications, past family history, past medical history, past social history, past surgical history and problem list. Problem list updated.  Objective:  There were no vitals filed for this visit.  Fetal Status:           General:  Alert, oriented and cooperative. Patient is in no acute distress.  Skin: Skin is warm and dry. No rash noted.   Cardiovascular: Normal heart rate noted  Respiratory: Normal respiratory effort, no problems with respiration noted  Abdomen: Soft, gravid, appropriate for gestational age.       Pelvic:  Cervical exam deferred        Extremities: Normal range of motion.     Mental Status: Normal mood and affect. Normal behavior. Normal judgment and thought content.   Urinalysis:      Assessment and Plan:  Pregnancy: G1P0 at 6097w6d  There are no diagnoses linked to this encounter. Preterm labor symptoms and general obstetric precautions including but not limited to vaginal bleeding, contractions, leaking of fluid and fetal movement were reviewed in detail with the patient. Please refer to After Visit Summary for other counseling recommendations.  Return in about 2 weeks (around 05/26/2016) for [facility].  Aviva SignsMarie L Luisfelipe Engelstad, CNM

## 2016-05-12 NOTE — Patient Instructions (Signed)

## 2016-05-12 NOTE — Progress Notes (Signed)
28 wk labs today  28 wk packet given  tdap and flu vaccine given

## 2016-05-13 ENCOUNTER — Telehealth: Payer: Self-pay

## 2016-05-13 LAB — HIV ANTIBODY (ROUTINE TESTING W REFLEX): HIV 1&2 Ab, 4th Generation: NONREACTIVE

## 2016-05-13 LAB — RPR

## 2016-05-13 LAB — GLUCOSE TOLERANCE, 1 HOUR (50G) W/O FASTING: Glucose, 1 Hr, gestational: 95 mg/dL (ref <140)

## 2016-05-13 NOTE — Telephone Encounter (Signed)
Pt just got tdap 05/12/16 and wanted to know if got one in 10/2015. Advised no that pt got a flu shot 10/2015. Pt voiced understanding.

## 2016-05-24 ENCOUNTER — Inpatient Hospital Stay (HOSPITAL_COMMUNITY)
Admission: AD | Admit: 2016-05-24 | Discharge: 2016-05-24 | Disposition: A | Discharge status: Home / Self Care | Payer: Medicaid Other | Source: Ambulatory Visit | Attending: Obstetrics & Gynecology | Admitting: Obstetrics & Gynecology

## 2016-05-24 ENCOUNTER — Encounter (HOSPITAL_COMMUNITY): Payer: Self-pay

## 2016-05-24 DIAGNOSIS — O26893 Other specified pregnancy related conditions, third trimester: Secondary | ICD-10-CM | POA: Diagnosis not present

## 2016-05-24 DIAGNOSIS — N949 Unspecified condition associated with female genital organs and menstrual cycle: Secondary | ICD-10-CM

## 2016-05-24 DIAGNOSIS — O9989 Other specified diseases and conditions complicating pregnancy, childbirth and the puerperium: Secondary | ICD-10-CM | POA: Diagnosis not present

## 2016-05-24 DIAGNOSIS — R102 Pelvic and perineal pain: Secondary | ICD-10-CM | POA: Diagnosis not present

## 2016-05-24 DIAGNOSIS — Z3A28 28 weeks gestation of pregnancy: Secondary | ICD-10-CM | POA: Insufficient documentation

## 2016-05-24 DIAGNOSIS — R109 Unspecified abdominal pain: Secondary | ICD-10-CM | POA: Diagnosis present

## 2016-05-24 LAB — URINALYSIS, ROUTINE W REFLEX MICROSCOPIC
BILIRUBIN URINE: NEGATIVE
Glucose, UA: 500 mg/dL — AB
HGB URINE DIPSTICK: NEGATIVE
Ketones, ur: NEGATIVE mg/dL
Leukocytes, UA: NEGATIVE
Nitrite: NEGATIVE
Protein, ur: NEGATIVE mg/dL
SPECIFIC GRAVITY, URINE: 1.015 (ref 1.005–1.030)
pH: 6.5 (ref 5.0–8.0)

## 2016-05-24 NOTE — MAU Note (Signed)
Started about 3:20 this morning, was a 10 from then until about 6:30,  By 7:30 was much more comfortable.  Has had 3 episodes since lasted 15-4930min then stopped  No bleeding or leaking.  No hx of PTL

## 2016-05-24 NOTE — MAU Provider Note (Signed)
History   Heidi Orr is a 20 yo @ 28.4wks G1P0 who presents with bilateral abd pain x2 occurrences, this am and around noon. Denies any VB or LOF. + FM.  CSN: [id number]  Arrival date & time 05/24/16  1524   None     Chief Complaint  Patient presents with  . Abdominal Pain    HPI  Past Medical History:  Diagnosis Date  . Complication of anesthesia    High fever from nitrous   . Syncope 2009  . UTI (lower urinary tract infection)     Past Surgical History:  Procedure Laterality Date  . TOOTH EXTRACTION      History reviewed. No pertinent family history.  Social History  Substance Use Topics  . Smoking status: Never Smoker  . Smokeless tobacco: Never Used  . Alcohol use No     Comment: very little, not while pregnant    OB History    Gravida Para Term Preterm AB Living   1             SAB TAB Ectopic Multiple Live Births                  Review of Systems  Constitutional: Negative.   HENT: Negative.   Eyes: Negative.   Respiratory: Negative.   Cardiovascular: Negative.   Gastrointestinal: Positive for abdominal pain.  Endocrine: Negative.   Genitourinary: Negative.   Musculoskeletal: Negative.   Skin: Negative.   Allergic/Immunologic: Negative.   Neurological: Negative.   Hematological: Negative.   Psychiatric/Behavioral: Negative.     Allergies  Review of patient's allergies indicates no known allergies.  Home Medications    BP 120/65 (BP Location: Right Arm)   Pulse 94   Temp 98.2 F (36.8 C) (Oral)   Resp 16   Wt 129 lb 8 oz (58.7 kg)   LMP 09/15/2015   BMI 23.69 kg/m   Physical Exam  Constitutional: She is oriented to person, place, and time. She appears well-developed and well-nourished.  HENT:  Head: Normocephalic.  Eyes: Conjunctivae are normal. Pupils are equal, round, and reactive to light.  Neck: Normal range of motion. Neck supple.  Cardiovascular: Normal rate and regular rhythm.   Pulmonary/Chest: Effort normal and  breath sounds normal.  Abdominal: Soft. Bowel sounds are normal.  Genitourinary: Vagina normal and uterus normal.  Musculoskeletal: Normal range of motion.  Neurological: She is alert and oriented to person, place, and time.  Skin: Skin is warm and dry.  Psychiatric: She has a normal mood and affect. Her behavior is normal. Judgment and thought content normal.    MAU Course  Procedures (including critical care time)  Labs Reviewed  URINALYSIS, ROUTINE W REFLEX [facility]CROSCOPIC ([facility]) - Abnormal; Notable for the following:       Result Value   Glucose, UA 500 (*)    All other components within normal limits   No results found.   No diagnosis found.    MDM  1. Round ligament pain   Plan: SVE: 0/0/-3 (post); Discussed round ligament pain, common discomforts of preg., and increased water intake; will d/c home and f/u in clinic

## 2016-05-24 NOTE — Discharge Instructions (Signed)
Round Ligament Pain  The round ligament is a cord of muscle and tissue that helps to support the uterus. It can become a source of pain during pregnancy if it becomes stretched or twisted as the baby grows. The pain usually begins in the second trimester of pregnancy, and it can come and go until the baby is delivered. It is not a serious problem, and it does not cause harm to the baby.  Round ligament pain is usually a short, sharp, and pinching pain, but it can also be a dull, lingering, and aching pain. The pain is felt in the lower side of the abdomen or in the groin. It usually starts deep in the groin and moves up to the outside of the hip area. Pain can occur with:   A sudden change in position.   Rolling over in bed.   Coughing or sneezing.   Physical activity.  HOME CARE INSTRUCTIONS  Watch your condition for any changes. Take these steps to help with your pain:   When the pain starts, relax. Then try:    Sitting down.    Flexing your knees up to your abdomen.    Lying on your side with one pillow under your abdomen and another pillow between your legs.    Sitting in a warm bath for 15-20 minutes or until the pain goes away.   Take over-the-counter and prescription medicines only as told by your health care provider.   Move slowly when you sit and stand.   Avoid long walks if they cause pain.   Stop or lessen your physical activities if they cause pain.  SEEK MEDICAL CARE IF:   Your pain does not go away with treatment.   You feel pain in your back that you did not have before.   Your medicine is not helping.  SEEK IMMEDIATE MEDICAL CARE IF:   You develop a fever or chills.   You develop uterine contractions.   You develop vaginal bleeding.   You develop nausea or vomiting.   You develop diarrhea.   You have pain when you urinate.     This information is not intended to replace advice given to you by your health care provider. Make sure you discuss any questions you have with your health  care provider.     Document Released: 05/26/2008 Document Revised: 11/09/2011 Document Reviewed: 10/24/2014  Elsevier Interactive Patient Education 2016 [employer]

## 2016-05-28 ENCOUNTER — Ambulatory Visit (INDEPENDENT_AMBULATORY_CARE_PROVIDER_SITE_OTHER): Payer: Medicaid Other | Admitting: Obstetrics & Gynecology

## 2016-05-28 DIAGNOSIS — Z3483 Encounter for supervision of other normal pregnancy, third trimester: Secondary | ICD-10-CM

## 2016-05-28 NOTE — Progress Notes (Signed)
   PRENATAL VISIT NOTE  Subjective:  Heidi MassonKatie L Hufnagle is a 20 y.o. G1P0 at 7625w1d being seen today for ongoing prenatal care.  She is currently monitored for the following issues for this low-risk pregnancy and has IBS (irritable bowel syndrome); Abnormal weight loss; DUB (dysfunctional uterine bleeding); and Supervision of normal pregnancy, antepartum on her problem list.  Patient reports occasional contractions.  Contractions: Not present. Vag. Bleeding: None.  Movement: Present. Denies leaking of fluid.   The following portions of the patient's history were reviewed and updated as appropriate: allergies, current medications, past family history, past medical history, past social history, past surgical history and problem list. Problem list updated.  Objective:   Vitals:   05/28/16 1416  BP: 122/66  Pulse: 82  Weight: 129 lb 11.2 oz (58.8 kg)    Fetal Status: Fetal Heart Rate (bpm): 148 Fundal Height: 29 cm Movement: Present     General:  Alert, oriented and cooperative. Patient is in no acute distress.  Skin: Skin is warm and dry. No rash noted.   Cardiovascular: Normal heart rate noted  Respiratory: Normal respiratory effort, no problems with respiration noted  Abdomen: Soft, gravid, appropriate for gestational age. Pain/Pressure: Present     Pelvic:  Cervical exam deferred        Extremities: Normal range of motion.  Edema: None  Mental Status: Normal mood and affect. Normal behavior. Normal judgment and thought content.   Urinalysis:      Assessment and Plan:  Pregnancy: G1P0 at 7125w1d  1. Supervision of normal pregnancy, antepartum, third trimester MAU visit for rd ligament pain 9/24  Preterm labor symptoms and general obstetric precautions including but not limited to vaginal bleeding, contractions, leaking of fluid and fetal movement were reviewed in detail with the patient. Please refer to After Visit Summary for other counseling recommendations.  Return in about 2  weeks (around 06/11/2016).  Adam PhenixJames G Arnold, MD

## 2016-06-15 ENCOUNTER — Encounter: Payer: Self-pay | Admitting: Family Medicine

## 2016-06-16 ENCOUNTER — Ambulatory Visit (INDEPENDENT_AMBULATORY_CARE_PROVIDER_SITE_OTHER): Payer: Medicaid Other | Admitting: Advanced Practice Midwife

## 2016-06-16 VITALS — BP 118/62 | HR 78 | Wt 133.4 lb

## 2016-06-16 DIAGNOSIS — Z3403 Encounter for supervision of normal first pregnancy, third trimester: Secondary | ICD-10-CM

## 2016-06-16 NOTE — Patient Instructions (Addendum)
AREA PEDIATRIC/FAMILY PRACTICE PHYSICIANS  [facility] [address], [address] [place], [place]  [address] Phone - [phone]   Fax - [phone]  [facility] [address] [place], [place] [address] Phone - [phone]   Fax - [phone]  [facility] AM[facility] [address] [place], [place]  [address] Phone - [phone]   Fax - [phone]  [facility] [address] [place], [place]  [address] Phone - [phone]   Fax - [phone]  [facility] [address] [place], [place]  [address] Phone - [phone]   Fax - [phone]  [facility] [address] [place], [place]  [address] Phone - [phone]   Fax - [phone]  [facility] [address] [place], [place]  [address] Phone - [phone]   Fax - [phone]  [facility] [address], [address] [place], [place]  [address] Phone - [phone]   Fax - [phone]  [facility] [address] [place], [place]  [address] Phone - [phone]   Fax - [phone] [facility] [address] [place], [place]  [address] Phone - [phone]   Fax - [phone]  [facility] [address] [place], [place]  [address] Phone - [phone]   Fax - [phone]  [facility] [address] [place], [place]  [address] Phone - [phone]   Fax - [phone]  [facility] [address] [place], [place]  [address] Phone - [phone]   Fax - [phone]  SHILPA GOSRANI [address] [place], [place]  [address] Phone - [phone]  [facility] [address] [place], [place]  [address] Phone - [phone]   Fax - [phone]  [facility] [address] [place], [place]  [address] Phone - [phone]   Fax - [phone]  [facility] [address] [place], [place]  [address] Phone - [phone]   Fax - [phone]  [facility] [address] [place], [place]  [address] Phone - [phone]   Fax - [phone]   [facility] [address] [place], [place]  [address] Phone - [phone]   Fax - [phone]  [facility] [address] [place], [place]  [address] Phone - [phone]   Fax - [phone]  DAVID RUBIN [address] [place], [place]  [address] Phone - [phone]   Fax - [phone]  [facility] [address] , [place]  [address] Phone - [phone]   Fax - [phone]  [facility] [address] , [place]  [address] Phone - [phone]   Fax - [phone] [facility] [address] [place], [place]  [address] Phone - [phone]   Fax - [phone]  [facility] [address] [place], [place]  [address] Phone - [phone]   Fax - [phone]  [facility] [address] [place],   [address] Phone - [phone]   Fax - [phone]    Third Trimester of Pregnancy The third trimester is from week 29 through week 42, months 7 through 9. The third trimester is a time when the fetus is growing rapidly. At the   end of the ninth month, the fetus is about 20 inches in length and weighs 6-10 pounds.  BODY CHANGES Your body goes through many changes during pregnancy. The changes vary from woman to woman.   Your weight will continue to increase. You can expect to gain 25-35 pounds (11-16 kg) by the end of the pregnancy.  You may begin to get stretch marks on your hips, abdomen, and breasts.  You may urinate more often because the fetus is moving lower into your  pelvis and pressing on your bladder.  You may develop or continue to have heartburn as a result of your pregnancy.  You may develop constipation because certain hormones are causing the muscles that push waste through your intestines to slow down.  You may develop hemorrhoids or swollen, bulging veins (varicose veins).  You may have pelvic pain because of the weight gain and pregnancy hormones relaxing your joints between the bones in your pelvis. Backaches may result from overexertion of the muscles supporting your posture.  You may have changes in your hair. These can include thickening of your hair, rapid growth, and changes in texture. Some women also have hair loss during or after pregnancy, or hair that feels dry or thin. Your hair will most likely return to normal after your baby is born.  Your breasts will continue to grow and be tender. A yellow discharge may leak from your breasts called colostrum.  Your belly button may stick out.  You may feel short of breath because of your expanding uterus.  You may notice the fetus "dropping," or moving lower in your abdomen.  You may have a bloody mucus discharge. This usually occurs a few days to a week before labor begins.  Your cervix becomes thin and soft (effaced) near your due date. WHAT TO EXPECT AT YOUR PRENATAL EXAMS  You will have prenatal exams every 2 weeks until week 36. Then, you will have weekly prenatal exams. During a routine prenatal visit:  You will be weighed to make sure you and the fetus are growing normally.  Your blood pressure is taken.  Your abdomen will be measured to track your baby's growth.  The fetal heartbeat will be listened to.  Any test results from the previous visit will be discussed.  You may have a cervical check near your due date to see if you have effaced. At around 36 weeks, your caregiver will check your cervix. At the same time, your caregiver will also perform a test on the secretions  of the vaginal tissue. This test is to determine if a type of bacteria, Group B streptococcus, is present. Your caregiver will explain this further. Your caregiver may ask you:  What your birth plan is.  How you are feeling.  If you are feeling the baby move.  If you have had any abnormal symptoms, such as leaking fluid, bleeding, severe headaches, or abdominal cramping.  If you are using any tobacco products, including cigarettes, chewing tobacco, and electronic cigarettes.  If you have any questions. Other tests or screenings that may be performed during your third trimester include:  Blood tests that check for low iron levels (anemia).  Fetal testing to check the health, activity level, and growth of the fetus. Testing is done if you have certain medical conditions or if there are problems during the pregnancy.  HIV (human immunodeficiency virus) testing. If you are at high risk, you may be screened for HIV during your third trimester of pregnancy. FALSE   LABOR You may feel small, irregular contractions that eventually go away. These are called Braxton Hicks contractions, or false labor. Contractions may last for hours, days, or even weeks before true labor sets in. If contractions come at regular intervals, intensify, or become painful, it is best to be seen by your caregiver.  SIGNS OF LABOR   Menstrual-like cramps.  Contractions that are 5 minutes apart or less.  Contractions that start on the top of the uterus and spread down to the lower abdomen and back.  A sense of increased pelvic pressure or back pain.  A watery or bloody mucus discharge that comes from the vagina. If you have any of these signs before the 37th week of pregnancy, call your caregiver right away. You need to go to the hospital to get checked immediately. HOME CARE INSTRUCTIONS   Avoid all smoking, herbs, alcohol, and unprescribed drugs. These chemicals affect the formation and growth of the baby.  Do  not use any tobacco products, including cigarettes, chewing tobacco, and electronic cigarettes. If you need help quitting, ask your health care provider. You may receive counseling support and other resources to help you quit.  Follow your caregiver's instructions regarding medicine use. There are medicines that are either safe or unsafe to take during pregnancy.  Exercise only as directed by your caregiver. Experiencing uterine cramps is a good sign to stop exercising.  Continue to eat regular, healthy meals.  Wear a good support bra for breast tenderness.  Do not use hot tubs, steam rooms, or saunas.  Wear your seat belt at all times when driving.  Avoid raw meat, uncooked cheese, cat litter boxes, and soil used by cats. These carry germs that can cause birth defects in the baby.  Take your prenatal vitamins.  Take 1500-2000 mg of calcium daily starting at the 20th week of pregnancy until you deliver your baby.  Try taking a stool softener (if your caregiver approves) if you develop constipation. Eat more high-fiber foods, such as fresh vegetables or fruit and whole grains. Drink plenty of fluids to keep your urine clear or pale yellow.  Take warm sitz baths to soothe any pain or discomfort caused by hemorrhoids. Use hemorrhoid cream if your caregiver approves.  If you develop varicose veins, wear support hose. Elevate your feet for 15 minutes, 3-4 times a day. Limit salt in your diet.  Avoid heavy lifting, wear low heal shoes, and practice good posture.  Rest a lot with your legs elevated if you have leg cramps or low back pain.  Visit your dentist if you have not gone during your pregnancy. Use a soft toothbrush to brush your teeth and be gentle when you floss.  A sexual relationship may be continued unless your caregiver directs you otherwise.  Do not travel far distances unless it is absolutely necessary and only with the approval of your caregiver.  Take prenatal classes to  understand, practice, and ask questions about the labor and delivery.  Make a trial run to the hospital.  Pack your hospital bag.  Prepare the baby's nursery.  Continue to go to all your prenatal visits as directed by your caregiver. SEEK MEDICAL CARE IF:  You are unsure if you are in labor or if your water has broken.  You have dizziness.  You have mild pelvic cramps, pelvic pressure, or nagging pain in your abdominal area.  You have persistent nausea, vomiting, or diarrhea.  You have a bad smelling vaginal discharge.  You have   pain with urination. SEEK IMMEDIATE MEDICAL CARE IF:   You have a fever.  You are leaking fluid from your vagina.  You have spotting or bleeding from your vagina.  You have severe abdominal cramping or pain.  You have rapid weight loss or gain.  You have shortness of breath with chest pain.  You notice sudden or extreme swelling of your face, hands, ankles, feet, or legs.  You have not felt your baby move in over an hour.  You have severe headaches that do not go away with medicine.  You have vision changes.   This information is not intended to replace advice given to you by your health care provider. Make sure you discuss any questions you have with your health care provider.   Document Released: 08/11/2001 Document Revised: 09/07/2014 Document Reviewed: 10/18/2012 Elsevier Interactive Patient Education 2016 Elsevier Inc.  

## 2016-06-16 NOTE — Progress Notes (Signed)
   PRENATAL VISIT NOTE  Subjective:  Heidi MassonKatie L Orr is a 20 y.o. G1P0 at 84103w6d being seen today for ongoing prenatal care.  She is currently monitored for the following issues for this low-risk pregnancy and has IBS (irritable bowel syndrome); Abnormal weight loss; DUB (dysfunctional uterine bleeding); and Supervision of normal pregnancy, antepartum on her problem list.  Patient reports no complaints.  Contractions: Not present. Vag. Bleeding: None.  Movement: Present. Denies leaking of fluid.   The following portions of the patient's history were reviewed and updated as appropriate: allergies, current medications, past family history, past medical history, past social history, past surgical history and problem list. Problem list updated.  Objective:   Vitals:   06/16/16 0813  BP: 118/62  Pulse: 78  Weight: 133 lb 6.4 oz (60.5 kg)    Fetal Status: Fetal Heart Rate (bpm): 149 Fundal Height: 31 cm Movement: Present     General:  Alert, oriented and cooperative. Patient is in no acute distress.  Skin: Skin is warm and dry. No rash noted.   Cardiovascular: Normal heart rate noted  Respiratory: Normal respiratory effort, no problems with respiration noted  Abdomen: Soft, gravid, appropriate for gestational age. Pain/Pressure: Present     Pelvic:  Cervical exam deferred        Extremities: Normal range of motion.  Edema: Trace  Mental Status: Normal mood and affect. Normal behavior. Normal judgment and thought content.   Assessment and Plan:  Pregnancy: G1P0 at 68103w6d  1. Encounter for supervision of normal first pregnancy in third trimester   Preterm labor symptoms and general obstetric precautions including but not limited to vaginal bleeding, contractions, leaking of fluid and fetal movement were reviewed in detail with the patient. Please refer to After Visit Summary for other counseling recommendations.  Return in 2 weeks (on 06/30/2016).  Dorathy KinsmanVirginia Zareya Tuckett, CNM

## 2016-06-30 ENCOUNTER — Ambulatory Visit (INDEPENDENT_AMBULATORY_CARE_PROVIDER_SITE_OTHER): Payer: Medicaid Other | Admitting: Obstetrics and Gynecology

## 2016-06-30 VITALS — BP 118/49 | HR 86 | Wt 135.8 lb

## 2016-06-30 DIAGNOSIS — Z3403 Encounter for supervision of normal first pregnancy, third trimester: Secondary | ICD-10-CM

## 2016-06-30 NOTE — Patient Instructions (Signed)

## 2016-06-30 NOTE — Progress Notes (Signed)
   PRENATAL VISIT NOTE  Subjective:  Heidi MassonKatie L Orr is a 20 y.o. G1P0 at 3251w6d being seen today for ongoing prenatal care.  She is currently monitored for the following issues for this low-risk pregnancy and has IBS (irritable bowel syndrome); Abnormal weight loss; DUB (dysfunctional uterine bleeding); and Supervision of normal pregnancy, antepartum on her problem list.  Patient reports no complaints.  Contractions: Not present.  .  Movement: Present. Denies leaking of fluid.   The following portions of the patient's history were reviewed and updated as appropriate: allergies, current medications, past family history, past medical history, past social history, past surgical history and problem list. Problem list updated.  Objective:   Vitals:   06/30/16 0936  BP: (!) 118/49  Pulse: 86  Weight: 135 lb 12.8 oz (61.6 kg)    Fetal Status: Fetal Heart Rate (bpm): 140   Movement: Present     General:  Alert, oriented and cooperative. Patient is in no acute distress.  Skin: Skin is warm and dry. No rash noted.   Cardiovascular: Normal heart rate noted  Respiratory: Normal respiratory effort, no problems with respiration noted  Abdomen: Soft, gravid, appropriate for gestational age. Pain/Pressure: Present     Pelvic:  Cervical exam deferred        Extremities: Normal range of motion.  Edema: None  Mental Status: Normal mood and affect. Normal behavior. Normal judgment and thought content.   Assessment and Plan:  Pregnancy: G1P0 at 7451w6d  1. Encounter for supervision of normal first pregnancy in third trimester Doing well> ? Breech/oblique today, will watch size/dates  Preterm labor symptoms and general obstetric precautions including but not limited to vaginal bleeding, contractions, leaking of fluid and fetal movement were reviewed in detail with the patient. Please refer to After Visit Summary for other counseling recommendations.  Return in about 2 weeks (around 07/14/2016).  Consider US for growth/ AFV if longitudinal lie and size<dates next  Danae Orleanseirdre C Mckenzie Toruno, CNM

## 2016-07-14 ENCOUNTER — Ambulatory Visit (INDEPENDENT_AMBULATORY_CARE_PROVIDER_SITE_OTHER): Payer: Medicaid Other | Admitting: Certified Nurse Midwife

## 2016-07-14 ENCOUNTER — Other Ambulatory Visit (HOSPITAL_COMMUNITY)
Admission: RE | Admit: 2016-07-14 | Discharge: 2016-07-14 | Disposition: A | Discharge status: Home / Self Care | Payer: BLUE CROSS/BLUE SHIELD | Source: Ambulatory Visit | Attending: Certified Nurse Midwife | Admitting: Certified Nurse Midwife

## 2016-07-14 VITALS — BP 123/69 | HR 89 | Wt 139.3 lb

## 2016-07-14 DIAGNOSIS — Z3400 Encounter for supervision of normal first pregnancy, unspecified trimester: Secondary | ICD-10-CM

## 2016-07-14 DIAGNOSIS — Z3403 Encounter for supervision of normal first pregnancy, third trimester: Secondary | ICD-10-CM

## 2016-07-14 DIAGNOSIS — O26843 Uterine size-date discrepancy, third trimester: Secondary | ICD-10-CM | POA: Diagnosis not present

## 2016-07-14 DIAGNOSIS — Z113 Encounter for screening for infections with a predominantly sexual mode of transmission: Secondary | ICD-10-CM

## 2016-07-14 LAB — OB RESULTS CONSOLE GBS: GBS: NEGATIVE

## 2016-07-14 LAB — OB RESULTS CONSOLE GC/CHLAMYDIA: GC PROBE AMP, GENITAL: NEGATIVE

## 2016-07-14 NOTE — Progress Notes (Signed)
36 wk cultures today  OB f/u US scheduled for November 16th@ 1515.  Pt notified.

## 2016-07-14 NOTE — Progress Notes (Signed)
Subjective:  Heidi Orr is a 20 y.o. G1P0 at 1055w6d being seen today for ongoing prenatal care.  She is currently monitored for the following issues for this low-risk pregnancy and has IBS (irritable bowel syndrome); Abnormal weight loss; DUB (dysfunctional uterine bleeding); and Supervision of normal pregnancy, antepartum on her problem list.  Patient reports no complaints.  Contractions: Not present. Vag. Bleeding: None.  Movement: Present. Denies leaking of fluid.   The following portions of the patient's history were reviewed and updated as appropriate: allergies, current medications, past family history, past medical history, past social history, past surgical history and problem list. Problem list updated.  Objective:   Vitals:   07/14/16 1245  BP: 123/69  Pulse: 89  Weight: 139 lb 4.8 oz (63.2 kg)    Fetal Status: Fetal Heart Rate (bpm): 142 Fundal Height: 33 cm Movement: Present  Presentation: Vertex  General:  Alert, oriented and cooperative. Patient is in no acute distress.  Skin: Skin is warm and dry. No rash noted.   Cardiovascular: Normal heart rate noted  Respiratory: Normal respiratory effort, no problems with respiration noted  Abdomen: Soft, gravid, appropriate for gestational age. Pain/Pressure: Absent     Pelvic: Vag. Bleeding: None     Cervical exam performed Dilation: Fingertip Effacement (%): 50 Station: -2  Extremities: Normal range of motion.  Edema: Trace  Mental Status: Normal mood and affect. Normal behavior. Normal judgment and thought content.   Urinalysis:      Assessment and Plan:  Pregnancy: G1P0 at 4155w6d  1. Supervision of low-risk first pregnancy, third trimester  - Culture, beta strep (group b only) - [facility]lamydia [facility] - US MFM OB FOLLOW UP; Future  2. Size less than dates discrepancy  - growth US scheduled   Term labor symptoms and general obstetric precautions including but not limited to vaginal  bleeding, contractions, leaking of fluid and fetal movement were reviewed in detail with the patient. Please refer to After Visit Summary for other counseling recommendations.  Return in about 1 week (around 07/21/2016).   Donette LarryMelanie Loic Hobin, CNM

## 2016-07-16 ENCOUNTER — Ambulatory Visit (HOSPITAL_COMMUNITY)
Admission: RE | Admit: 2016-07-16 | Discharge: 2016-07-16 | Disposition: A | Discharge status: Home / Self Care | Payer: Medicaid Other | Source: Ambulatory Visit | Attending: Certified Nurse Midwife | Admitting: Certified Nurse Midwife

## 2016-07-16 ENCOUNTER — Other Ambulatory Visit: Payer: Self-pay | Admitting: Certified Nurse Midwife

## 2016-07-16 DIAGNOSIS — Z3403 Encounter for supervision of normal first pregnancy, third trimester: Secondary | ICD-10-CM

## 2016-07-16 DIAGNOSIS — O26843 Uterine size-date discrepancy, third trimester: Secondary | ICD-10-CM

## 2016-07-16 DIAGNOSIS — Z3A35 35 weeks gestation of pregnancy: Secondary | ICD-10-CM | POA: Insufficient documentation

## 2016-07-16 LAB — GC/CHLAMYDIA PROBE AMP (~~LOC~~) NOT AT ARMC
Chlamydia: NEGATIVE
Neisseria Gonorrhea: NEGATIVE

## 2016-07-16 LAB — CULTURE, BETA STREP (GROUP B ONLY)

## 2016-07-27 DIAGNOSIS — O26843 Uterine size-date discrepancy, third trimester: Secondary | ICD-10-CM | POA: Insufficient documentation

## 2016-07-27 NOTE — Addendum Note (Signed)
Encounter addended by: Donette LarryMelanie Euan Wandler, CNM on: 07/27/2016  9:56 AM<BR>    Actions taken: Visit diagnoses modified, Episode edited, Problem List modified

## 2016-07-29 ENCOUNTER — Encounter: Payer: Self-pay | Admitting: Advanced Practice Midwife

## 2016-07-29 ENCOUNTER — Ambulatory Visit (INDEPENDENT_AMBULATORY_CARE_PROVIDER_SITE_OTHER): Payer: Medicaid Other | Admitting: Advanced Practice Midwife

## 2016-07-29 VITALS — BP 119/71 | HR 77 | Wt 142.0 lb

## 2016-07-29 DIAGNOSIS — Z3400 Encounter for supervision of normal first pregnancy, unspecified trimester: Secondary | ICD-10-CM

## 2016-07-29 DIAGNOSIS — O26843 Uterine size-date discrepancy, third trimester: Secondary | ICD-10-CM

## 2016-07-29 NOTE — Progress Notes (Signed)
   PRENATAL VISIT NOTE  Subjective:  Vicente MassonKatie L Clonch is a 20 y.o. G1P0 at 6743w0d being seen today for ongoing prenatal care.  She is currently monitored for the following issues for this low-risk pregnancy and has IBS (irritable bowel syndrome); Abnormal weight loss; DUB (dysfunctional uterine bleeding); Supervision of normal pregnancy, antepartum; and Significant discrepancy between uterine size and clinical dates, antepartum, third trimester on her problem list.  Patient reports occasional contractions.  Contractions: Irritability. Vag. Bleeding: None.  Movement: Present. Denies leaking of fluid.   The following portions of the patient's history were reviewed and updated as appropriate: allergies, current medications, past family history, past medical history, past social history, past surgical history and problem list. Problem list updated.  Objective:   Vitals:   07/29/16 1248 07/29/16 1249  BP: 138/83 119/71  Pulse: 76 77  Weight: 142 lb (64.4 kg)     Fetal Status: Fetal Heart Rate (bpm): 140   Movement: Present     General:  Alert, oriented and cooperative. Patient is in no acute distress.  Skin: Skin is warm and dry. No rash noted.   Cardiovascular: Normal heart rate noted  Respiratory: Normal respiratory effort, no problems with respiration noted  Abdomen: Soft, gravid, appropriate for gestational age. Pain/Pressure: Present     Pelvic:  Cervical exam deferred        Extremities: Normal range of motion.  Edema: Mild pitting, slight indentation  Mental Status: Normal mood and affect. Normal behavior. Normal judgment and thought content.   Assessment and Plan:  Pregnancy: G1P0 at 3543w0d  1. Supervision of normal first pregnancy, antepartum     Long discussion of signs of labor  2. Significant discrepancy between uterine size and clinical dates, antepartum, third trimester     Measures within range now  Term labor symptoms and general obstetric precautions including but not  limited to vaginal bleeding, contractions, leaking of fluid and fetal movement were reviewed in detail with the patient. Please refer to After Visit Summary for other counseling recommendations.  Return in about 1 week (around 08/05/2016) for [facility].   Aviva SignsMarie L Williams, CNM

## 2016-07-29 NOTE — Patient Instructions (Signed)

## 2016-08-05 ENCOUNTER — Ambulatory Visit (INDEPENDENT_AMBULATORY_CARE_PROVIDER_SITE_OTHER): Payer: Medicaid Other | Admitting: Medical

## 2016-08-05 VITALS — BP 128/72 | HR 75 | Wt 143.0 lb

## 2016-08-05 DIAGNOSIS — Z3403 Encounter for supervision of normal first pregnancy, third trimester: Secondary | ICD-10-CM

## 2016-08-05 NOTE — Progress Notes (Signed)
   PRENATAL VISIT NOTE  Subjective:  Vicente MassonKatie L Wickersham is a 20 y.o. G1P0 at 3122w0d being seen today for ongoing prenatal care.  She is currently monitored for the following issues for this low-risk pregnancy and has IBS (irritable bowel syndrome); Abnormal weight loss; DUB (dysfunctional uterine bleeding); Supervision of normal pregnancy, antepartum; and Significant discrepancy between uterine size and clinical dates, antepartum, third trimester on her problem list.  Patient reports no complaints.  Contractions: Irritability. Vag. Bleeding: None.  Movement: Present. Denies leaking of fluid.   The following portions of the patient's history were reviewed and updated as appropriate: allergies, current medications, past family history, past medical history, past social history, past surgical history and problem list. Problem list updated.  Objective:   Vitals:   08/05/16 1234  BP: 128/72  Pulse: 75  Weight: 143 lb (64.9 kg)    Fetal Status: Fetal Heart Rate (bpm): 135 Fundal Height: 37 cm Movement: Present  Presentation: Vertex  General:  Alert, oriented and cooperative. Patient is in no acute distress.  Skin: Skin is warm and dry. No rash noted.   Cardiovascular: Normal heart rate noted  Respiratory: Normal respiratory effort, no problems with respiration noted  Abdomen: Soft, gravid, appropriate for gestational age. Pain/Pressure: Present     Pelvic:  Cervical exam performed Dilation: Fingertip Effacement (%): 50 Station: -1  Extremities: Normal range of motion.  Edema: Trace  Mental Status: Normal mood and affect. Normal behavior. Normal judgment and thought content.   Assessment and Plan:  Pregnancy: G1P0 at 722w0d  1. Encounter for supervision of normal first pregnancy in third trimester - Patient doing well, no complaints, labor signs reviewed  Term labor symptoms and general obstetric precautions including but not limited to vaginal bleeding, contractions, leaking of fluid and  fetal movement were reviewed in detail with the patient. Please refer to After Visit Summary for other counseling recommendations.  Return in about 1 week (around 08/12/2016) for LROB.   Marny LowensteinJulie N Charmane Protzman, PA-C

## 2016-08-05 NOTE — Progress Notes (Signed)
  PRENATAL VISIT NOTE  Subjective:  Heidi MassonKatie L Franks is a 20 y.o. G1P0 at 3157w0d being seen today for ongoing prenatal care.  She is currently monitored for the following issues for this low-risk pregnancy and has IBS (irritable bowel syndrome); Abnormal weight loss; DUB (dysfunctional uterine bleeding); Supervision of normal pregnancy, antepartum; and Significant discrepancy between uterine size and clinical dates, antepartum, third trimester on her problem list.  Patient reports intermittent lower abdominal cramps with fetal movement and occasional pelvic cramping with walking; mild shortness of breath after exertion but no chest pain. She experienced a cracked tooth last week and received antibiotics from her dentist (possibly clindamycin but she doesn't recall the name); still taking. Contractions: Irritability. .Movement: Present. Denies leaking of fluid.   The following portions of the patient's history were reviewed and updated as appropriate: allergies, current medications, past family history, past medical history, past social history, past surgical history and problem list. Problem list updated.  Objective:   Vitals:   08/05/16 1234  BP: 128/72  Pulse: 75  Weight: 143 lb (64.9 kg)    Fetal Status: Fetal Heart Rate (bpm): 135   Movement: Present     General:  Alert, oriented and cooperative. Patient is in no acute distress.  Skin: Skin is warm and dry. No rash noted.   Cardiovascular: Normal heart rate noted  Respiratory: Normal respiratory effort, no problems with respiration noted  Abdomen: Soft, gravid, appropriate for gestational age. Pain/Pressure: Present     Pelvic:  Cervical exam deferred        Extremities: Normal range of motion.     Mental Status: Normal mood and affect. Normal behavior. Normal judgment and thought content.   Assessment and Plan:  Pregnancy: G1P0 at 6157w0d  There are no diagnoses linked to this encounter. Term labor symptoms and general obstetric  precautions including but not limited to vaginal bleeding, contractions, leaking of fluid and fetal movement were reviewed in detail with the patient. Please refer to After Visit Summary for other counseling recommendations.  Advised patient to follow up in one week in clinic.   Dub AmisJessica J Zanai Mallari, Medical Student  [facility]

## 2016-08-05 NOTE — Patient Instructions (Signed)
Introduction Patient Name: ________________________________________________ Patient Due Date: ____________________ What is a fetal movement count? A fetal movement count is the number of times that you feel your baby move during a certain amount of time. This may also be called a fetal kick count. A fetal movement count is recommended for every pregnant woman. You may be asked to start counting fetal movements as early as week 28 of your pregnancy. Pay attention to when your baby is most active. You may notice your baby's sleep and wake cycles. You may also notice things that make your baby move more. You should do a fetal movement count:  When your baby is normally most active.  At the same time each day. A good time to count movements is while you are resting, after having something to eat and drink. How do I count fetal movements? 1. Find a quiet, comfortable area. Sit, or lie down on your side. 2. Write down the date, the start time and stop time, and the number of movements that you felt between those two times. Take this information with you to your health care visits. 3. For 2 hours, count kicks, flutters, swishes, rolls, and jabs. You should feel at least 10 movements during 2 hours. 4. You may stop counting after you have felt 10 movements. 5. If you do not feel 10 movements in 2 hours, have something to eat and drink. Then, keep resting and counting for 1 hour. If you feel at least 4 movements during that hour, you may stop counting. Contact a health care provider if:  You feel fewer than 4 movements in 2 hours.  Your baby is not moving like he or she usually does. Date: ____________ Start time: ____________ Stop time: ____________ Movements: ____________ Date: ____________ Start time: ____________ Stop time: ____________ Movements: ____________ Date: ____________ Start time: ____________ Stop time: ____________ Movements: ____________ Date: ____________ Start time: ____________  Stop time: ____________ Movements: ____________ Date: ____________ Start time: ____________ Stop time: ____________ Movements: ____________ Date: ____________ Start time: ____________ Stop time: ____________ Movements: ____________ Date: ____________ Start time: ____________ Stop time: ____________ Movements: ____________ Date: ____________ Start time: ____________ Stop time: ____________ Movements: ____________ Date: ____________ Start time: ____________ Stop time: ____________ Movements: ____________ This information is not intended to replace advice given to you by your health care provider. Make sure you discuss any questions you have with your health care provider. Document Released: 09/16/2006 Document Revised: 04/15/2016 Document Reviewed: 09/26/2015 Elsevier Interactive Patient Education  2017 [employer] Braxton Hicks Contractions Contractions of the uterus can occur throughout pregnancy. Contractions are not always a sign that you are in labor.  WHAT ARE BRAXTON HICKS CONTRACTIONS?  Contractions that occur before labor are called Braxton Hicks contractions, or false labor. Toward the end of pregnancy (32-34 weeks), these contractions can develop more often and may become more forceful. This is not true labor because these contractions do not result in opening (dilatation) and thinning of the cervix. They are sometimes difficult to tell apart from true labor because these contractions can be forceful and people have different pain tolerances. You should not feel embarrassed if you go to the hospital with false labor. Sometimes, the only way to tell if you are in true labor is for your health care provider to look for changes in the cervix. If there are no prenatal problems or other health problems associated with the pregnancy, it is completely safe to be sent home with false labor and await the onset of true labor.   HOW CAN YOU TELL THE DIFFERENCE BETWEEN TRUE AND FALSE LABOR? False Labor     The contractions of false labor are usually shorter and not as hard as those of true labor.   The contractions are usually irregular.   The contractions are often felt in the front of the lower abdomen and in the groin.   The contractions may go away when you walk around or change positions while lying down.   The contractions get weaker and are shorter lasting as time goes on.   The contractions do not usually become progressively stronger, regular, and closer together as with true labor.  True Labor   Contractions in true labor last 30-70 seconds, become very regular, usually become more intense, and increase in frequency.   The contractions do not go away with walking.   The discomfort is usually felt in the top of the uterus and spreads to the lower abdomen and low back.   True labor can be determined by your health care provider with an exam. This will show that the cervix is dilating and getting thinner.  WHAT TO REMEMBER  Keep up with your usual exercises and follow other instructions given by your health care provider.   Take medicines as directed by your health care provider.   Keep your regular prenatal appointments.   Eat and drink lightly if you think you are going into labor.   If Braxton Hicks contractions are making you uncomfortable:   Change your position from lying down or resting to walking, or from walking to resting.   Sit and rest in a tub of warm water.   Drink 2-3 glasses of water. Dehydration may cause these contractions.   Do slow and deep breathing several times an hour.  WHEN SHOULD I SEEK IMMEDIATE MEDICAL CARE? Seek immediate medical care if:  Your contractions become stronger, more regular, and closer together.   You have fluid leaking or gushing from your vagina.   You have a fever.   You pass blood-tinged mucus.   You have vaginal bleeding.   You have continuous abdominal pain.   You have low back pain  that you never had before.   You feel your baby's head pushing down and causing pelvic pressure.   Your baby is not moving as much as it used to.  This information is not intended to replace advice given to you by your health care provider. Make sure you discuss any questions you have with your health care provider. Document Released: 08/17/2005 Document Revised: 12/09/2015 Document Reviewed: 05/29/2013 Elsevier Interactive Patient Education  2017 Elsevier Inc.  

## 2016-08-10 ENCOUNTER — Inpatient Hospital Stay (HOSPITAL_COMMUNITY): Payer: BLUE CROSS/BLUE SHIELD | Admitting: Anesthesiology

## 2016-08-10 ENCOUNTER — Inpatient Hospital Stay (HOSPITAL_COMMUNITY)
Admission: AD | Admit: 2016-08-10 | Discharge: 2016-08-12 | DRG: 775 | Disposition: A | Discharge status: Home / Self Care | Payer: BLUE CROSS/BLUE SHIELD | Source: Ambulatory Visit | Attending: Obstetrics and Gynecology | Admitting: Obstetrics and Gynecology

## 2016-08-10 ENCOUNTER — Encounter (HOSPITAL_COMMUNITY): Payer: Self-pay

## 2016-08-10 ENCOUNTER — Inpatient Hospital Stay (HOSPITAL_COMMUNITY)
Admission: AD | Admit: 2016-08-10 | Discharge: 2016-08-10 | Disposition: A | Discharge status: Home / Self Care | Payer: Medicaid Other | Source: Ambulatory Visit | Attending: Family Medicine | Admitting: Family Medicine

## 2016-08-10 DIAGNOSIS — Z3A38 38 weeks gestation of pregnancy: Secondary | ICD-10-CM | POA: Insufficient documentation

## 2016-08-10 DIAGNOSIS — Z3400 Encounter for supervision of normal first pregnancy, unspecified trimester: Secondary | ICD-10-CM

## 2016-08-10 DIAGNOSIS — O9962 Diseases of the digestive system complicating childbirth: Secondary | ICD-10-CM | POA: Diagnosis not present

## 2016-08-10 DIAGNOSIS — K589 Irritable bowel syndrome without diarrhea: Secondary | ICD-10-CM | POA: Diagnosis not present

## 2016-08-10 DIAGNOSIS — O26843 Uterine size-date discrepancy, third trimester: Secondary | ICD-10-CM

## 2016-08-10 DIAGNOSIS — Z3493 Encounter for supervision of normal pregnancy, unspecified, third trimester: Secondary | ICD-10-CM | POA: Diagnosis not present

## 2016-08-10 DIAGNOSIS — Z3403 Encounter for supervision of normal first pregnancy, third trimester: Secondary | ICD-10-CM | POA: Diagnosis present

## 2016-08-10 HISTORY — DX: Other specified health status: Z78.9

## 2016-08-10 LAB — TYPE AND SCREEN
ABO/RH(D): A POS
Antibody Screen: NEGATIVE

## 2016-08-10 LAB — CBC
HCT: 38.4 % (ref 36.0–46.0)
HEMOGLOBIN: 13.3 g/dL (ref 12.0–15.0)
MCH: 30.2 pg (ref 26.0–34.0)
MCHC: 34.6 g/dL (ref 30.0–36.0)
MCV: 87.1 fL (ref 78.0–100.0)
Platelets: 228 10*3/uL (ref 150–400)
RBC: 4.41 MIL/uL (ref 3.87–5.11)
RDW: 13.4 % (ref 11.5–15.5)
WBC: 14 10*3/uL — ABNORMAL HIGH (ref 4.0–10.5)

## 2016-08-10 MED ORDER — PHENYLEPHRINE 40 MCG/ML (10ML) SYRINGE FOR IV PUSH (FOR BLOOD PRESSURE SUPPORT)
80.0000 ug | PREFILLED_SYRINGE | INTRAVENOUS | Status: DC | PRN
  Filled 2016-08-10: qty 5

## 2016-08-10 MED ORDER — ACETAMINOPHEN 325 MG PO TABS: 650.0000 mg | ORAL_TABLET | ORAL | Status: DC | PRN

## 2016-08-10 MED ORDER — EPHEDRINE 5 MG/ML INJ
10.0000 mg | INTRAVENOUS | Status: DC | PRN
  Filled 2016-08-10: qty 4

## 2016-08-10 MED ORDER — SOD CITRATE-CITRIC ACID 500-334 MG/5ML PO SOLN: 30.0000 mL | ORAL | Status: DC | PRN

## 2016-08-10 MED ORDER — OXYTOCIN 40 UNITS IN LACTATED RINGERS INFUSION - SIMPLE MED
2.5000 [IU]/h | INTRAVENOUS | Status: DC
  Administered 2016-08-11: 2.5 [IU]/h via INTRAVENOUS
  Filled 2016-08-10: qty 1000

## 2016-08-10 MED ORDER — PHENYLEPHRINE 40 MCG/ML (10ML) SYRINGE FOR IV PUSH (FOR BLOOD PRESSURE SUPPORT)
80.0000 ug | PREFILLED_SYRINGE | INTRAVENOUS | Status: DC | PRN
  Filled 2016-08-10: qty 5
  Filled 2016-08-10: qty 10

## 2016-08-10 MED ORDER — OXYCODONE-ACETAMINOPHEN 5-325 MG PO TABS: 1.0000 | ORAL_TABLET | ORAL | Status: DC | PRN

## 2016-08-10 MED ORDER — LIDOCAINE HCL (PF) 1 % IJ SOLN
30.0000 mL | INTRAMUSCULAR | Status: DC | PRN
  Filled 2016-08-10: qty 30

## 2016-08-10 MED ORDER — OXYTOCIN BOLUS FROM INFUSION
500.0000 mL | Freq: Once | INTRAVENOUS | Status: AC
  Administered 2016-08-11: 500 mL via INTRAVENOUS

## 2016-08-10 MED ORDER — LACTATED RINGERS IV SOLN
INTRAVENOUS | Status: DC
  Administered 2016-08-10 (×3): via INTRAVENOUS

## 2016-08-10 MED ORDER — DIPHENHYDRAMINE HCL 50 MG/ML IJ SOLN: 12.5000 mg | INTRAMUSCULAR | Status: DC | PRN

## 2016-08-10 MED ORDER — OXYCODONE-ACETAMINOPHEN 5-325 MG PO TABS: 2.0000 | ORAL_TABLET | ORAL | Status: DC | PRN

## 2016-08-10 MED ORDER — ONDANSETRON HCL 4 MG/2ML IJ SOLN
4.0000 mg | Freq: Four times a day (QID) | INTRAMUSCULAR | Status: DC | PRN
  Administered 2016-08-10: 4 mg via INTRAVENOUS
  Filled 2016-08-10: qty 2

## 2016-08-10 MED ORDER — FENTANYL 2.5 MCG/ML BUPIVACAINE 1/10 % EPIDURAL INFUSION (WH - ANES)
14.0000 mL/h | INTRAMUSCULAR | Status: DC | PRN
  Administered 2016-08-10 – 2016-08-11 (×2): 14 mL/h via EPIDURAL
  Filled 2016-08-10 (×2): qty 100

## 2016-08-10 MED ORDER — LIDOCAINE HCL (PF) 1 % IJ SOLN
INTRAMUSCULAR | Status: DC | PRN
  Administered 2016-08-10 (×2): 5 mL via EPIDURAL

## 2016-08-10 MED ORDER — LACTATED RINGERS IV SOLN: 500.0000 mL | INTRAVENOUS | Status: DC | PRN

## 2016-08-10 MED ORDER — LACTATED RINGERS IV SOLN
500.0000 mL | Freq: Once | INTRAVENOUS | Status: AC
  Administered 2016-08-10: 500 mL via INTRAVENOUS

## 2016-08-10 NOTE — Anesthesia Preprocedure Evaluation (Signed)
Anesthesia Evaluation  Patient identified by MRN, date of birth, ID band Patient awake    Reviewed: Allergy & Precautions, NPO status , Patient's Chart, lab work & pertinent test results  Airway Mallampati: II  TM Distance: >3 FB Neck ROM: Full    Dental no notable dental hx. (+) Dental Advisory Given   Pulmonary neg pulmonary ROS,    Pulmonary exam normal        Cardiovascular negative cardio ROS Normal cardiovascular exam     Neuro/Psych negative neurological ROS  negative psych ROS   GI/Hepatic negative GI ROS, Neg liver ROS,   Endo/Other  negative endocrine ROS  Renal/GU negative Renal ROS  negative genitourinary   Musculoskeletal negative musculoskeletal ROS (+)   Abdominal   Peds negative pediatric ROS (+)  Hematology negative hematology ROS (+)   Anesthesia Other Findings   Reproductive/Obstetrics (+) Pregnancy                             Anesthesia Physical Anesthesia Plan  ASA: II  Anesthesia Plan: Epidural   Post-op Pain Management:    Induction:   Airway Management Planned: Natural Airway  Additional Equipment:   Intra-op Plan:   Post-operative Plan:   Informed Consent: I have reviewed the patients History and Physical, chart, labs and discussed the procedure including the risks, benefits and alternatives for the proposed anesthesia with the patient or authorized representative who has indicated his/her understanding and acceptance.   Dental advisory given  Plan Discussed with:   Anesthesia Plan Comments:         Anesthesia Quick Evaluation

## 2016-08-10 NOTE — MAU Note (Signed)
Woke up about 5 this morning, noted some pinkish color when she wipes.  Took a shower around 8, then laid back down.  Around 10, went to bathroom noted pinkish when she wipes, notes red blood on liner. Is contracting. No water leaking. No hx of low lying or previa.

## 2016-08-10 NOTE — H&P (Signed)
L&D History and Physical   Heidi MassonKatie L Orr is a 20 y.o. female G1 @ 38.5wks presenting for pink discharge on wiping and contractions she estimates to be 5-10 min apart. She has a history of IBS, abnormal weight loss, and dysfunctional uterine bleeding.  OB History    Gravida Para Term Preterm AB Living   1             SAB TAB Ectopic Multiple Live Births                 Past Medical History:  Diagnosis Date  . Medical history non-contributory   . Syncope 2009  . UTI (lower urinary tract infection)    Past Surgical History:  Procedure Laterality Date  . TOOTH EXTRACTION     Family History: family history is not on file. Social History:  reports that she has never smoked. She has never used smokeless tobacco. She reports that she does not drink alcohol or use drugs.    Maternal Diabetes: No Genetic Screening: Normal Maternal Ultrasounds/Referrals: Normal Fetal Ultrasounds or other Referrals:  None Maternal Substance Abuse:  No Significant Maternal Medications:  None Significant Maternal Lab Results:  Lab values include: Group B Strep negative Other Comments:  None  Review of Systems  All other systems reviewed and are negative.  Maternal Medical History:  Reason for admission: Contractions and vaginal bleeding.   Contractions: Onset was 3-5 hours ago.   Frequency: regular.   Perceived severity is moderate.    Fetal activity: Perceived fetal activity is normal.   Last perceived fetal movement was within the past hour.    Prenatal complications: Preterm labor.   Prenatal Complications - Diabetes: none.    Dilation: 5.5 Effacement (%): 90 Station: 0 Exam by:: Otelia SanteeBriea Parks RN Blood pressure 143/88, pulse 80, temperature 97.7 F (36.5 C), resp. rate 18, height 5\' 2"  (1.575 m), weight 64.9 kg (143 lb), last menstrual period 10/16/2015.   Maternal Exam:  Uterine Assessment: Contraction frequency is regular.   Abdomen: Patient reports no abdominal tenderness.    Physical Exam  Constitutional: She is oriented to person, place, and time. She appears well-developed and well-nourished.  HENT:  Head: Normocephalic and atraumatic.  Neck: Normal range of motion.  Cardiovascular: Normal rate and intact distal pulses.   No murmur heard. Respiratory: Effort normal and breath sounds normal. No respiratory distress.  GI: Soft.  Musculoskeletal: Normal range of motion.  Neurological: She is alert and oriented to person, place, and time.  Skin: Skin is warm and dry.    Prenatal labs: ABO, Rh: --/--/A POS (12/11 2140) Antibody: NEG (12/11 2140) Rubella: 1.69 (07/05 1057) Immune RPR: NON REAC (09/12 0001)  HBsAg: NEGATIVE (07/05 1057)  HIV: NONREACTIVE (09/12 0001)  GBS:   Negative  Assessment: Heidi MassonKatie L Orr is a 20 y.o. G1P0 at 4444w5d here for SOL.    #Labor: Anticipate NSVD.   #Pain: Epidural #FWB: Cat I #ID:  GBS Neg #MOF: breast #MOC:IUD #Circ:  in  Freddrick MarchYashika Amin, MD PGY-1 08/10/2016, 11:20 PM   CNM attestation:  I have seen and examined this patient; I agree with above documentation in the resident's note.   Heidi MassonKatie L Correll is a 20 y.o. G1P0 here for SOL  PE: BP 113/75   Pulse (!) 112   Temp 98.4 F (36.9 C) (Oral)   Resp 18   Ht 5\' 2"  (1.575 m)   Wt 64.9 kg (143 lb)   LMP 10/16/2015   SpO2 100%  BMI 26.16 kg/m  Gen: calm comfortable, NAD Resp: normal effort, no distress Abd: gravid  ROS, labs, PMH reviewed  [facility] GBS neg Anticipate SVD  SHAW, KIMBERLY CNM 08/11/2016, 5:24 AM

## 2016-08-10 NOTE — Discharge Instructions (Signed)
Introduction Patient Name: ________________________________________________ Patient Due Date: ____________________ What is a fetal movement count? A fetal movement count is the number of times that you feel your baby move during a certain amount of time. This may also be called a fetal kick count. A fetal movement count is recommended for every pregnant woman. You may be asked to start counting fetal movements as early as week 28 of your pregnancy. Pay attention to when your baby is most active. You may notice your baby's sleep and wake cycles. You may also notice things that make your baby move more. You should do a fetal movement count:  When your baby is normally most active.  At the same time each day. A good time to count movements is while you are resting, after having something to eat and drink. How do I count fetal movements? 1. Find a quiet, comfortable area. Sit, or lie down on your side. 2. Write down the date, the start time and stop time, and the number of movements that you felt between those two times. Take this information with you to your health care visits. 3. For 2 hours, count kicks, flutters, swishes, rolls, and jabs. You should feel at least 10 movements during 2 hours. 4. You may stop counting after you have felt 10 movements. 5. If you do not feel 10 movements in 2 hours, have something to eat and drink. Then, keep resting and counting for 1 hour. If you feel at least 4 movements during that hour, you may stop counting. Contact a health care provider if:  You feel fewer than 4 movements in 2 hours.  Your baby is not moving like he or she usually does. Date: ____________ Start time: ____________ Stop time: ____________ Movements: ____________ Date: ____________ Start time: ____________ Stop time: ____________ Movements: ____________ Date: ____________ Start time: ____________ Stop time: ____________ Movements: ____________ Date: ____________ Start time: ____________  Stop time: ____________ Movements: ____________ Date: ____________ Start time: ____________ Stop time: ____________ Movements: ____________ Date: ____________ Start time: ____________ Stop time: ____________ Movements: ____________ Date: ____________ Start time: ____________ Stop time: ____________ Movements: ____________ Date: ____________ Start time: ____________ Stop time: ____________ Movements: ____________ Date: ____________ Start time: ____________ Stop time: ____________ Movements: ____________ This information is not intended to replace advice given to you by your health care provider. Make sure you discuss any questions you have with your health care provider. Document Released: 09/16/2006 Document Revised: 04/15/2016 Document Reviewed: 09/26/2015 Elsevier Interactive Patient Education  2017 [employer] Inc. Braxton Hicks Contractions Contractions of the uterus can occur throughout pregnancy. Contractions are not always a sign that you are in labor.  WHAT ARE BRAXTON HICKS CONTRACTIONS?  Contractions that occur before labor are called Braxton Hicks contractions, or false labor. Toward the end of pregnancy (32-34 weeks), these contractions can develop more often and may become more forceful. This is not true labor because these contractions do not result in opening (dilatation) and thinning of the cervix. They are sometimes difficult to tell apart from true labor because these contractions can be forceful and people have different pain tolerances. You should not feel embarrassed if you go to the hospital with false labor. Sometimes, the only way to tell if you are in true labor is for your health care provider to look for changes in the cervix. If there are no prenatal problems or other health problems associated with the pregnancy, it is completely safe to be sent home with false labor and await the onset of true labor.   HOW CAN YOU TELL THE DIFFERENCE BETWEEN TRUE AND FALSE LABOR? False Labor     The contractions of false labor are usually shorter and not as hard as those of true labor.   The contractions are usually irregular.   The contractions are often felt in the front of the lower abdomen and in the groin.   The contractions may go away when you walk around or change positions while lying down.   The contractions get weaker and are shorter lasting as time goes on.   The contractions do not usually become progressively stronger, regular, and closer together as with true labor.  True Labor   Contractions in true labor last 30-70 seconds, become very regular, usually become more intense, and increase in frequency.   The contractions do not go away with walking.   The discomfort is usually felt in the top of the uterus and spreads to the lower abdomen and low back.   True labor can be determined by your health care provider with an exam. This will show that the cervix is dilating and getting thinner.  WHAT TO REMEMBER  Keep up with your usual exercises and follow other instructions given by your health care provider.   Take medicines as directed by your health care provider.   Keep your regular prenatal appointments.   Eat and drink lightly if you think you are going into labor.   If Braxton Hicks contractions are making you uncomfortable:   Change your position from lying down or resting to walking, or from walking to resting.   Sit and rest in a tub of warm water.   Drink 2-3 glasses of water. Dehydration may cause these contractions.   Do slow and deep breathing several times an hour.  WHEN SHOULD I SEEK IMMEDIATE MEDICAL CARE? Seek immediate medical care if:  Your contractions become stronger, more regular, and closer together.   You have fluid leaking or gushing from your vagina.   You have a fever.   You pass blood-tinged mucus.   You have vaginal bleeding.   You have continuous abdominal pain.   You have low back pain  that you never had before.   You feel your baby's head pushing down and causing pelvic pressure.   Your baby is not moving as much as it used to.  This information is not intended to replace advice given to you by your health care provider. Make sure you discuss any questions you have with your health care provider. Document Released: 08/17/2005 Document Revised: 12/09/2015 Document Reviewed: 05/29/2013 Elsevier Interactive Patient Education  2017 Elsevier Inc.  

## 2016-08-10 NOTE — MAU Note (Signed)
Pt here earlier. Reports contractions much closer together and stronger. Now every 3-5 mins. Denies LOF. Some bloody mucous. +FM. Was 2cm earlier.

## 2016-08-11 ENCOUNTER — Encounter (HOSPITAL_COMMUNITY): Payer: Self-pay | Admitting: *Deleted

## 2016-08-11 DIAGNOSIS — Z3A38 38 weeks gestation of pregnancy: Secondary | ICD-10-CM

## 2016-08-11 LAB — ABO/RH: ABO/RH(D): A POS

## 2016-08-11 LAB — RPR: RPR Ser Ql: NONREACTIVE

## 2016-08-11 MED ORDER — TETANUS-DIPHTH-ACELL PERTUSSIS 5-2.5-18.5 LF-MCG/0.5 IM SUSP: 0.5000 mL | Freq: Once | INTRAMUSCULAR | Status: DC

## 2016-08-11 MED ORDER — METHYLERGONOVINE MALEATE 0.2 MG PO TABS: 0.2000 mg | ORAL_TABLET | ORAL | Status: DC | PRN

## 2016-08-11 MED ORDER — IBUPROFEN 600 MG PO TABS
600.0000 mg | ORAL_TABLET | Freq: Four times a day (QID) | ORAL | Status: DC
  Administered 2016-08-11 – 2016-08-12 (×5): 600 mg via ORAL
  Filled 2016-08-11 (×5): qty 1

## 2016-08-11 MED ORDER — ZOLPIDEM TARTRATE 5 MG PO TABS: 5.0000 mg | ORAL_TABLET | Freq: Every evening | ORAL | Status: DC | PRN

## 2016-08-11 MED ORDER — WITCH HAZEL-GLYCERIN EX PADS: 1.0000 "application " | MEDICATED_PAD | CUTANEOUS | Status: DC | PRN

## 2016-08-11 MED ORDER — ONDANSETRON HCL 4 MG PO TABS: 4.0000 mg | ORAL_TABLET | ORAL | Status: DC | PRN

## 2016-08-11 MED ORDER — OXYCODONE HCL 5 MG PO TABS: 5.0000 mg | ORAL_TABLET | ORAL | Status: DC | PRN

## 2016-08-11 MED ORDER — ONDANSETRON HCL 4 MG/2ML IJ SOLN: 4.0000 mg | INTRAMUSCULAR | Status: DC | PRN

## 2016-08-11 MED ORDER — TERBUTALINE SULFATE 1 MG/ML IJ SOLN
0.2500 mg | Freq: Once | INTRAMUSCULAR | Status: DC | PRN
  Filled 2016-08-11: qty 1

## 2016-08-11 MED ORDER — DIPHENHYDRAMINE HCL 25 MG PO CAPS: 25.0000 mg | ORAL_CAPSULE | Freq: Four times a day (QID) | ORAL | Status: DC | PRN

## 2016-08-11 MED ORDER — MEASLES, MUMPS & RUBELLA VAC ~~LOC~~ INJ
0.5000 mL | INJECTION | Freq: Once | SUBCUTANEOUS | Status: DC
  Filled 2016-08-11: qty 0.5

## 2016-08-11 MED ORDER — BENZOCAINE-MENTHOL 20-0.5 % EX AERO
1.0000 "application " | INHALATION_SPRAY | CUTANEOUS | Status: DC | PRN
  Administered 2016-08-11: 1 via TOPICAL
  Filled 2016-08-11: qty 56

## 2016-08-11 MED ORDER — METHYLERGONOVINE MALEATE 0.2 MG/ML IJ SOLN: 0.2000 mg | INTRAMUSCULAR | Status: DC | PRN

## 2016-08-11 MED ORDER — PRENATAL MULTIVITAMIN CH
1.0000 | ORAL_TABLET | Freq: Every day | ORAL | Status: DC
  Administered 2016-08-12: 1 via ORAL
  Filled 2016-08-11: qty 1

## 2016-08-11 MED ORDER — COCONUT OIL OIL: 1.0000 "application " | TOPICAL_OIL | Status: DC | PRN

## 2016-08-11 MED ORDER — OXYTOCIN 40 UNITS IN LACTATED RINGERS INFUSION - SIMPLE MED
1.0000 m[IU]/min | INTRAVENOUS | Status: DC
  Administered 2016-08-11: 2 m[IU]/min via INTRAVENOUS

## 2016-08-11 MED ORDER — ACETAMINOPHEN 325 MG PO TABS: 650.0000 mg | ORAL_TABLET | ORAL | Status: DC | PRN

## 2016-08-11 MED ORDER — DIBUCAINE 1 % RE OINT: 1.0000 "application " | TOPICAL_OINTMENT | RECTAL | Status: DC | PRN

## 2016-08-11 MED ORDER — SIMETHICONE 80 MG PO CHEW: 80.0000 mg | CHEWABLE_TABLET | ORAL | Status: DC | PRN

## 2016-08-11 MED ORDER — OXYCODONE HCL 5 MG PO TABS: 10.0000 mg | ORAL_TABLET | ORAL | Status: DC | PRN

## 2016-08-11 MED ORDER — SENNOSIDES-DOCUSATE SODIUM 8.6-50 MG PO TABS
2.0000 | ORAL_TABLET | ORAL | Status: DC
  Filled 2016-08-11: qty 2

## 2016-08-11 NOTE — Progress Notes (Signed)
08/11/16 at 1124  (late entry at 1233)        Epidural catheter removed intact, site unremarkable. Bandaid applied to site. Patient tolerated well.  No site placement charted on flowsheet by Anesthesiologist, so unable to chart removal on flowsheet.

## 2016-08-11 NOTE — Lactation Note (Signed)
This note was copied from a baby's chart. Lactation Consultation Note LC attempted visit at 11 hours of age.  Mom in bathroom.  Lc returned at 13 hours of mom and baby asleep. Lc reported to RN unable to consult today.  Lc to encouraged mom to latch baby STS with early feeding cues.    Patient Name: Heidi Orr ZOXWR'UToday's Date: 08/11/2016     Maternal Data    Feeding    LATCH Score/Interventions                      Lactation Tools Discussed/Used     Consult Status      Jadarius Commons, Arvella MerlesJana Lynn 08/11/2016, 11:14 PM

## 2016-08-11 NOTE — Anesthesia Postprocedure Evaluation (Signed)
Anesthesia Post Note  Patient: Vicente MassonKatie L Villwock  Procedure(s) Performed: * No procedures listed *  Patient location during evaluation: Mother Baby Anesthesia Type: Epidural Level of consciousness: awake and awake and alert Pain management: pain level controlled Vital Signs Assessment: post-procedure vital signs reviewed and stable Respiratory status: spontaneous breathing Cardiovascular status: stable Postop Assessment: no headache, no backache, epidural receding, no signs of nausea or vomiting, patient able to bend at knees and adequate PO intake Anesthetic complications: no     Last Vitals:  Vitals:   08/11/16 1150 08/11/16 1250  BP: (!) 142/72 135/70  Pulse: 89 92  Resp: 18 20  Temp: 37 C 36.7 C    Last Pain:  Vitals:   08/11/16 1250  TempSrc: Oral  PainSc: 0-No pain   Pain Goal: Patients Stated Pain Goal: 7 (08/11/16 0800)               Odette FractionOLLINS,Dillian Feig J

## 2016-08-11 NOTE — Progress Notes (Signed)
Patient ID: Heidi MassonKatie L Ferch, female   DOB: 10-18-1995, 20 y.o.   MRN: [id number]  OB Interim Progress Note  S:patient comfortable at this time with epidural.  Agreeable to AROM to help with progress.   O: BP 109/64   Pulse (!) 118   Temp 98.4 F (36.9 C) (Oral)   Resp 18   Ht 5\' 2"  (1.575 m)   Wt 143 lb (64.9 kg)   LMP 10/16/2015   SpO2 100%   BMI 26.16 kg/m    FHT: baseline 150s, moderate variability, +accels, no decels  Dilation: 10 Dilation Complete Date: 08/11/16 Dilation Complete Time: 0231 Effacement (%): 100 Station: 0 Presentation: Vertex Exam by:: Barrie DunkerMurayyah Johnson RN  A/P: AROM at 0445.  Continue expectant management Anticipate SVD  Freddrick MarchYashika Amin, MD 08/11/2016, 4:50 AM PGY-1  I have seen and examined this patient and I agree with the above. Cam HaiSHAW, Nyjai Graff CNM 5:26 AM 08/11/2016

## 2016-08-12 ENCOUNTER — Ambulatory Visit: Payer: Self-pay

## 2016-08-12 ENCOUNTER — Encounter: Payer: Medicaid Other | Admitting: Medical

## 2016-08-12 MED ORDER — IBUPROFEN 600 MG PO TABS: 600.0000 mg | ORAL_TABLET | Freq: Four times a day (QID) | ORAL | 0 refills | Status: DC

## 2016-08-12 MED ORDER — ACETAMINOPHEN 325 MG PO TABS: 650.0000 mg | ORAL_TABLET | ORAL | 0 refills | Status: DC | PRN

## 2016-08-12 NOTE — Lactation Note (Signed)
This note was copied from a baby's chart. Lactation Consultation Note LC reviewed chart and noted short feedings with some EBM supplementing.  Baby has good output recorded.  LC attempted visit at 32 hours of age,  Visitors reports mom is asleep and baby just returned from getting circumcision.  LC encouraged family to have mom call for assist with next feeding.   Baby is a baby patient and mom is discharged.     Patient Name: Heidi Lorretta HarpKatie Abbs NGEXB'MToday's Date: 08/12/2016     Maternal Data    Feeding Feeding Type: Breast Fed  LATCH Score/Interventions Latch: Repeated attempts needed to sustain latch, nipple held in mouth throughout feeding, stimulation needed to elicit sucking reflex. Intervention(s): Adjust position;Assist with latch;Breast massage;Breast compression  Audible Swallowing: A few with stimulation Intervention(s): Skin to skin;Hand expression Intervention(s): Skin to skin;Hand expression;Alternate breast massage  Type of Nipple: Everted at rest and after stimulation  Comfort (Breast/Nipple): Soft / non-tender     Hold (Positioning): Assistance needed to correctly position infant at breast and maintain latch. Intervention(s): Breastfeeding basics reviewed;Support Pillows;Position options;Skin to skin  LATCH Score: 7  Lactation Tools Discussed/Used Tools: Pump   Consult Status      Gianelle Mccaul, Arvella MerlesJana Lynn 08/12/2016, 5:34 PM

## 2016-08-12 NOTE — Progress Notes (Signed)
Post discharge chart review completed.  

## 2016-08-12 NOTE — Discharge Instructions (Signed)

## 2016-08-12 NOTE — Lactation Note (Signed)
This note was copied from a baby's chart. Lactation Consultation Note Initial consult at 35 hours of age due to mom sleeping for previous visit attempts.  Mom reports baby is sleepy after circumcision.  Mom reports baby has been latching well to left breast, but not her right breast.  Mom has been using hand pump and syringe feeding baby 5-7610mls.  Mom denies pain with latch.  Mom is able to easily express drops of colostrum.  Mom has small breasts, semi compressible with small areola and everted nipples.  Lc attempted to latch baby in cross cradle hold at right breast.  Baby is sleepy and remains [facility]. [facility] given and discussed.  Encouraged to feed with early cues on demand.  Early newborn behavior discussed.  Mom to call for assist as needed.   Mom called LC back to observe baby latched to right breast.  Baby in cross cradle hold mom denies pain with latch.  LC flanged lower lip for wider gape with instant audible swallows.  LC encouraged mom to compress breasts and observe for good jaw movements to keep baby active during feedings.  Baby is feeding well.       Patient Name: Heidi Orr HQION'GToday's Date: 08/12/2016 Reason for consult: Initial assessment   Maternal Data Has patient been taught Hand Expression?: Yes Does the patient have breastfeeding experience prior to this delivery?: No  Feeding Feeding Type: Breast Fed Length of feed: 10 min  LATCH Score/Interventions Latch: Repeated attempts needed to sustain latch, nipple held in mouth throughout feeding, stimulation needed to elicit sucking reflex. Intervention(s): Adjust position;Assist with latch;Breast massage;Breast compression  Audible Swallowing: Spontaneous and intermittent  Type of Nipple: Everted at rest and after stimulation  Comfort (Breast/Nipple): Soft / non-tender     Hold (Positioning): No assistance needed to correctly position infant at breast. Intervention(s): Breastfeeding basics  reviewed;Support Pillows;Position options;Skin to skin  LATCH Score: 9  Lactation Tools Discussed/Used     Consult Status Consult Status: Follow-up Date: 08/13/16 Follow-up type: In-patient    Jannifer RodneyShoptaw, [name] 08/12/2016, 9:02 PM

## 2016-08-12 NOTE — Discharge Summary (Signed)
OB Discharge Summary     Patient Name: Heidi MassonKatie L Orr DOB: 1996-08-06 MRN: [id number]  Date of admission: 08/10/2016 Delivering MD: Deforest HoylesWALLACE, NOAH I   Date of discharge: 08/12/2016  Admitting diagnosis: 38.5w ctx 3-5 min, pressure Intrauterine pregnancy: 3575w6d     Secondary diagnosis:  Active Problems:   Normal labor  Additional problems: none     Discharge diagnosis: Term Pregnancy Delivered                                                                                                Post partum procedures:none  Augmentation: none  Complications: None  Hospital course:  Onset of Labor With Vaginal Delivery     20 y.o. yo G1P1001 at 6775w6d was admitted in Latent Labor on 08/10/2016. Patient had an uncomplicated labor course as follows:  Membrane Rupture Time/Date: 4:47 AM ,08/11/2016   Intrapartum Procedures: Episiotomy: None [1]                                         Lacerations:  Labial [10]  Patient had a delivery of a Viable infant. 08/11/2016  Information for the patient's newborn:  Winfred Leedshillips, Boy Angelina [[id number]][[id number]]  Delivery Method: Vaginal, Spontaneous Delivery (Filed from Delivery Summary)    Pateint had an uncomplicated postpartum course.  She is ambulating, tolerating a regular diet, passing flatus, and urinating well. Patient is discharged home in stable condition on 08/12/16.    Physical exam Vitals:   08/11/16 1250 08/11/16 1650 08/12/16 0100 08/12/16 0612  BP: 135/70 131/80 130/78 120/69  Pulse: 92 93 90 68  Resp: 20 18 18 18   Temp: 98.1 F (36.7 C) 98.4 F (36.9 C) 98.2 F (36.8 C) 98.3 F (36.8 C)  TempSrc: Oral Oral Oral Oral  SpO2:      Weight:      Height:       General: alert, cooperative and no distress Lochia: appropriate Uterine Fundus: firm Incision: N/A DVT Evaluation: No evidence of DVT seen on physical exam. Negative Homan's sign. No cords or calf tenderness. No significant calf/ankle edema. Labs: Lab Results  Component  Value Date   WBC 14.0 (H) 08/10/2016   HGB 13.3 08/10/2016   HCT 38.4 08/10/2016   MCV 87.1 08/10/2016   PLT 228 08/10/2016   CMP Latest Ref Rng & Units 01/20/2016  Glucose 65 - 99 mg/dL 89  BUN 6 - 20 mg/dL 10  Creatinine 7.820.44 - 9.561.00 mg/dL 2.130.50  Sodium 086135 - 578145 mmol/L 137  Potassium 3.5 - 5.1 mmol/L 3.7  Chloride 101 - 111 mmol/L 102  CO2 19 - 32 mEq/L -  Calcium 8.4 - 10.5 mg/dL -  Total Protein 6.0 - 8.3 g/dL -  Total Bilirubin 0.2 - 1.2 mg/dL -  Alkaline Phos 47 - 469119 U/L -  AST 0 - 37 U/L -  ALT 0 - 35 U/L -    Discharge instruction: per After Visit Summary and "Baby and Me Booklet".  After visit meds:  Medication List    STOP taking these medications   prenatal multivitamin Tabs tablet     TAKE these medications   acetaminophen 500 MG tablet Commonly known as:  TYLENOL Take 250 mg by mouth every 6 (six) hours as needed for mild pain or headache. What changed:  Another medication with the same name was added. Make sure you understand how and when to take each.   acetaminophen 325 MG tablet Commonly known as:  TYLENOL Take 2 tablets (650 mg total) by mouth every 4 (four) hours as needed (for pain scale < 4). What changed:  You were already taking a medication with the same name, and this prescription was added. Make sure you understand how and when to take each.   ibuprofen 600 MG tablet Commonly known as:  ADVIL,MOTRIN Take 1 tablet (600 mg total) by mouth every 6 (six) hours.       Diet: routine diet  Activity: Advance as tolerated. Pelvic rest for 6 weeks.   Outpatient follow up:6 weeks Follow up Appt:Future Appointments Date Time Provider Department Center  09/22/2016 1:20 PM Hurshel PartyLisa A Leftwich-Kirby, CNM [facility]   Follow up Visit:No Follow-up on file.  Postpartum contraception: IUD Mirena  Newborn Data: Live born female  Birth Weight: 6 lb 12.5 oz (3076 g) APGAR: 6, 9  Baby Feeding: Breast Disposition:home with  mother   08/12/2016 Deforest HoylesWALLACE, NOAH I, DO PGY-3 OB FELLOW DISCHARGE ATTESTATION  I have seen and examined this patient and agree with above documentation in the resident's note.   Ernestina PennaNicholas Joshawn Crissman, MD 11:51 AM

## 2016-08-19 ENCOUNTER — Encounter: Payer: Medicaid Other | Admitting: Obstetrics and Gynecology

## 2016-09-22 ENCOUNTER — Encounter: Payer: Self-pay | Admitting: Advanced Practice Midwife

## 2016-09-22 ENCOUNTER — Ambulatory Visit (INDEPENDENT_AMBULATORY_CARE_PROVIDER_SITE_OTHER): Payer: BLUE CROSS/BLUE SHIELD | Admitting: Advanced Practice Midwife

## 2016-09-22 VITALS — BP 118/66 | HR 75 | Wt 120.0 lb

## 2016-09-22 DIAGNOSIS — Z30014 Encounter for initial prescription of intrauterine contraceptive device: Secondary | ICD-10-CM

## 2016-09-22 DIAGNOSIS — Z30430 Encounter for insertion of intrauterine contraceptive device: Secondary | ICD-10-CM | POA: Diagnosis not present

## 2016-09-22 DIAGNOSIS — Z3202 Encounter for pregnancy test, result negative: Secondary | ICD-10-CM

## 2016-09-22 LAB — POCT PREGNANCY, URINE: PREG TEST UR: NEGATIVE

## 2016-09-22 MED ORDER — CONCEPT OB 130-92.4-1 MG PO CAPS: 1.0000 | ORAL_CAPSULE | Freq: Every day | ORAL | 11 refills | Status: DC

## 2016-09-22 MED ORDER — LEVONORGESTREL 18.6 MCG/DAY IU IUD
INTRAUTERINE_SYSTEM | Freq: Once | INTRAUTERINE | Status: AC
  Administered 2016-09-22: 1 via INTRAUTERINE

## 2016-09-22 NOTE — Progress Notes (Signed)
Subjective:     Heidi MassonKatie L Fullbright is a 21 y.o. female who presents for a postpartum visit. She is 6 weeks postpartum following a vaginal delivery. I have fully reviewed the prenatal and intrapartum course. The delivery was at  38  gestational weeks. Outcome: Anesthesia: .Epidual Postpartum course has been uncomplicated. Baby's course has been uncomplicated. Baby is feeding by breast. Bleeding not at this time. Bowel function is normal . Bladder function is normal. Patient is not sexually active. Contraception method is none at this time but, desires IUD. Postpartum depression screening: negative.  The following portions of the patient's history were reviewed and updated as appropriate: allergies, current medications, past family history, past medical history, past social history, past surgical history and problem list.  Review of Systems A comprehensive review of systems was negative.   Objective:    BP 118/66   Pulse 75   Wt 120 lb (54.4 kg)   BMI 21.95 kg/m   VS reviewed, nursing note reviewed,  Constitutional: well developed, well nourished, no distress HEENT: normocephalic CV: normal rate Pulm/chest wall: normal effort Abdomen: soft Neuro: alert and oriented x 3 Skin: warm, dry Psych: affect normal  Pelvic exam: Cervix pink, visually closed, without lesion, scant white creamy discharge, vaginal walls and external genitalia normal  IUD Procedure Note Patient identified, informed consent performed.  Discussed risks of irregular bleeding, cramping, infection, malpositioning or misplacement of the IUD outside the uterus which may require further procedures. Time out was performed.  Urine pregnancy test negative.  Speculum placed in the vagina.  Cervix visualized.  Cleaned with Betadine x 2.  Grasped anteriorly with a single tooth tenaculum.  Uterus sounded to 5.5 cm.  Liletta IUD placed per manufacturer's recommendations.  Strings trimmed to 3 cm. Tenaculum was removed, good hemostasis  noted.  Patient tolerated procedure well.   Patient was also asked to check IUD strings periodically and follow up in 4-6 weeks for IUD check.  Assessment:     1. Encounter for initial prescription of intrauterine contraceptive device (IUD)  - levonorgestrel (LILETTA) 18.6 MCG/DAY IUD; by Intrauterine route once.  2. Postpartum examination following vaginal delivery      Plan:    1. Contraception: IUD  2. Follow up in: 4 weeks for string check then annually.

## 2016-09-24 LAB — POCT PREGNANCY, URINE: PREG TEST UR: NEGATIVE

## 2016-10-05 ENCOUNTER — Telehealth (HOSPITAL_COMMUNITY): Payer: Self-pay | Admitting: Lactation Services

## 2016-10-05 NOTE — Telephone Encounter (Signed)
Patient is the mother of a 337-week old infant. She had been given a nipple shield when in the hospital, but she lost it over the weekend. She went to [employer] to buy a nipple shield (size 24, but not a Medela brand) & infant won't latch with it. Mom cannot remember the size we provided & the charting in the infant's chart does not identify size. I told Mom that she would be welcome to come by our boutique and look at the Medela nipple shields to see if she recognized the size & then purchase it.  Glenetta HewKim Jamelia Varano, RN, IBCLC

## 2016-11-16 ENCOUNTER — Telehealth: Payer: Self-pay

## 2016-11-16 NOTE — Telephone Encounter (Signed)
Pt left v/m that Dr Ermalene SearingBedsole will receive fax requesting order for breast pump. Pt request cb when completed. The order for the breast pump can be faxed to [phone].

## 2016-11-23 NOTE — Telephone Encounter (Signed)
Pt left v/m requesting status of breast pump order.

## 2016-11-23 NOTE — Telephone Encounter (Signed)
Lilienne notified Breast Pump order was originally faxed on 11/17/2016 [place].  I re-faxed again today just in case they never received the fax on 11/17/2016.  Florentina AddisonKatie will check with [employer] to make sure they did receive fax today.

## 2016-11-24 DIAGNOSIS — Z3483 Encounter for supervision of other normal pregnancy, third trimester: Secondary | ICD-10-CM | POA: Diagnosis not present

## 2016-11-24 DIAGNOSIS — Z3482 Encounter for supervision of other normal pregnancy, second trimester: Secondary | ICD-10-CM | POA: Diagnosis not present

## 2017-02-01 ENCOUNTER — Other Ambulatory Visit (HOSPITAL_COMMUNITY)
Admission: RE | Admit: 2017-02-01 | Discharge: 2017-02-01 | Disposition: A | Discharge status: Home / Self Care | Payer: BLUE CROSS/BLUE SHIELD | Source: Ambulatory Visit | Attending: Family Medicine | Admitting: Family Medicine

## 2017-02-01 ENCOUNTER — Encounter: Payer: Self-pay | Admitting: Family Medicine

## 2017-02-01 ENCOUNTER — Ambulatory Visit (INDEPENDENT_AMBULATORY_CARE_PROVIDER_SITE_OTHER): Payer: BLUE CROSS/BLUE SHIELD | Admitting: Family Medicine

## 2017-02-01 VITALS — BP 124/64 | HR 67 | Ht 63.0 in | Wt 114.5 lb

## 2017-02-01 DIAGNOSIS — Z30431 Encounter for routine checking of intrauterine contraceptive device: Secondary | ICD-10-CM

## 2017-02-01 DIAGNOSIS — Z113 Encounter for screening for infections with a predominantly sexual mode of transmission: Secondary | ICD-10-CM

## 2017-02-01 DIAGNOSIS — Z975 Presence of (intrauterine) contraceptive device: Secondary | ICD-10-CM | POA: Insufficient documentation

## 2017-02-01 DIAGNOSIS — R102 Pelvic and perineal pain: Secondary | ICD-10-CM | POA: Diagnosis not present

## 2017-02-01 DIAGNOSIS — Z308 Encounter for other contraceptive management: Secondary | ICD-10-CM

## 2017-02-01 MED ORDER — DICLOFENAC SODIUM 75 MG PO TBEC: 75.0000 mg | DELAYED_RELEASE_TABLET | Freq: Two times a day (BID) | ORAL | 2 refills | Status: DC

## 2017-02-01 NOTE — Progress Notes (Signed)
   Subjective:    Patient ID: Heidi Orr is a 21 y.o. female presenting with No chief complaint on file.  on 02/01/2017  HPI: Has IUD in place since 6 wks pp. Now getting more cramping before or after her cycles. She is noting more pain with cycles. Passed out at work this past week. Cycles are 1-2 wks long.  Review of Systems  Constitutional: Negative for chills and fever.  Respiratory: Negative for shortness of breath.   Cardiovascular: Negative for chest pain.  Gastrointestinal: Negative for abdominal pain, nausea and vomiting.  Genitourinary: Negative for dysuria.  Skin: Negative for rash.      Objective:    BP 124/64   Pulse 67   Ht 5\' 3"  (1.6 m)   Wt 114 lb 8 oz (51.9 kg)   LMP 01/25/2017 (Approximate)   Breastfeeding? No   BMI 20.28 kg/m  Physical Exam  Constitutional: She is oriented to person, place, and time. She appears well-developed and well-nourished. No distress.  HENT:  Head: Normocephalic and atraumatic.  Eyes: No scleral icterus.  Neck: Neck supple.  Cardiovascular: Normal rate.   Pulmonary/Chest: Effort normal.  Abdominal: Soft.  Genitourinary:  Genitourinary Comments: IUD strings are 3 cm long and appear normal. Some CMT, white discharge.  Neurological: She is alert and oriented to person, place, and time.  Skin: Skin is warm and dry.  Psychiatric: She has a normal mood and affect.        Assessment & Plan:  IUD check up  Pelvic pain - check cultures, check u/s for IUD placement - Plan: Cervicovaginal ancillary only, US Transvaginal Non-OB, diclofenac (VOLTAREN) 75 MG EC tablet   Total face-to-face time with patient: 15 minutes. Over 50% of encounter was spent on counseling and coordination of care. No Follow-up on file.  Heidi Orr 02/01/2017 2:18 PM

## 2017-02-02 LAB — CERVICOVAGINAL ANCILLARY ONLY
Bacterial vaginitis: POSITIVE — AB
CANDIDA VAGINITIS: NEGATIVE
Chlamydia: NEGATIVE
Neisseria Gonorrhea: NEGATIVE
TRICH (WINDOWPATH): NEGATIVE

## 2017-02-03 ENCOUNTER — Ambulatory Visit (HOSPITAL_COMMUNITY)
Admission: RE | Admit: 2017-02-03 | Discharge: 2017-02-03 | Disposition: A | Discharge status: Home / Self Care | Payer: BLUE CROSS/BLUE SHIELD | Source: Ambulatory Visit | Attending: Family Medicine | Admitting: Family Medicine

## 2017-02-03 ENCOUNTER — Telehealth: Payer: Self-pay | Admitting: *Deleted

## 2017-02-03 DIAGNOSIS — N854 Malposition of uterus: Secondary | ICD-10-CM | POA: Insufficient documentation

## 2017-02-03 DIAGNOSIS — Z975 Presence of (intrauterine) contraceptive device: Secondary | ICD-10-CM | POA: Insufficient documentation

## 2017-02-03 DIAGNOSIS — R102 Pelvic and perineal pain: Secondary | ICD-10-CM | POA: Diagnosis not present

## 2017-02-03 MED ORDER — METRONIDAZOLE 500 MG PO TABS: 500.0000 mg | ORAL_TABLET | Freq: Two times a day (BID) | ORAL | 0 refills | Status: DC

## 2017-02-03 NOTE — Telephone Encounter (Addendum)
-----   Message from Reva Boresanya S Pratt, MD sent at 02/02/2017  5:16 PM EDT ----- Treat for BV--Flagyl 500 mg po bid # 14--no alcohol  Called pt @ home # and her mother answered.  She stated that Heidi Orr is @ work and will finish @ 5pm today.  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 I stated that we will call back. Pt needs to be informed of +BV and Rx has been prescribed and sent to her pharmacy. Per Dr. Shawnie PonsPratt, pt also needs to be informed that today's US shows her IUD is in correct position.   6/7  1510  Called pt and informed her of all test results as previously stated within this note and Rx sent to her pharmacy. She should not drink alcohol while taking the medication. Pt voiced understanding.

## 2017-03-01 ENCOUNTER — Encounter: Payer: Self-pay | Admitting: Family Medicine

## 2017-03-01 ENCOUNTER — Ambulatory Visit (INDEPENDENT_AMBULATORY_CARE_PROVIDER_SITE_OTHER): Payer: BLUE CROSS/BLUE SHIELD | Admitting: Family Medicine

## 2017-03-01 VITALS — BP 115/77 | HR 82 | Ht 63.0 in | Wt 112.5 lb

## 2017-03-01 DIAGNOSIS — Z30432 Encounter for removal of intrauterine contraceptive device: Secondary | ICD-10-CM

## 2017-03-01 DIAGNOSIS — Z30011 Encounter for initial prescription of contraceptive pills: Secondary | ICD-10-CM

## 2017-03-01 MED ORDER — NORGESTIMATE-ETH ESTRADIOL 0.25-35 MG-MCG PO TABS: 1.0000 | ORAL_TABLET | Freq: Every day | ORAL | 11 refills | Status: DC

## 2017-03-01 NOTE — Progress Notes (Signed)
   Subjective:    Patient ID: Heidi MassonKatie L Orr is a [age] y.o. female presenting with No chief complaint on file.  on 03/01/2017  HPI: She is s/p IUD placement 1/18 postpartum. Here for checkup 1 month ago and complains of painful cycles that are lasting 1-2 wks at a time. We checked pelvic sonogram and cultures and everything appears normal. Tried Doxycycline and it made her sick. She did not get any relief from the Voltaren. Desires to remove her IUD removed today. No longer nursing.  Review of Systems  Constitutional: Negative for chills and fever.  Respiratory: Negative for shortness of breath.   Cardiovascular: Negative for chest pain.  Gastrointestinal: Positive for abdominal pain. Negative for nausea and vomiting.  Genitourinary: Negative for dysuria.  Skin: Negative for rash.      Objective:    BP 115/77   Pulse 82   Ht 5\' 3"  (1.6 m)   Wt 112 lb 8 oz (51 kg)   BMI 19.93 kg/m  Physical Exam  Constitutional: She is oriented to person, place, and time. She appears well-developed and well-nourished. No distress.  HENT:  Head: Normocephalic and atraumatic.  Eyes: No scleral icterus.  Neck: Neck supple.  Cardiovascular: Normal rate.   Pulmonary/Chest: Effort normal.  Abdominal: Soft.  Neurological: She is alert and oriented to person, place, and time.  Skin: Skin is warm and dry.  Psychiatric: She has a normal mood and affect.   Procedure: Speculum placed inside vagina.  Cervix visualized.  Strings grasped with ring forceps.  IUD removed intact.    Assessment & Plan:   Problem List Items Addressed This Visit    None    Visit Diagnoses    Encounter for initial prescription of contraceptive pills    -  Primary   begin Sprintec today, take daily   Relevant Medications   norgestimate-ethinyl estradiol (ORTHO-CYCLEN,SPRINTEC,PREVIFEM) 0.25-35 MG-MCG tablet   Encounter for IUD removal          Total face-to-face time with patient: 15 minutes. Over 50% of encounter was  spent on counseling and coordination of care. Return in about 3 months (around 06/01/2017), or if symptoms worsen or fail to improve.  Reva Boresanya S Jayion Schneck 03/01/2017 11:02 AM

## 2017-03-01 NOTE — Patient Instructions (Signed)
Oral Contraception Information Oral contraceptive pills (OCPs) are medicines taken to prevent pregnancy. OCPs work by preventing the ovaries from releasing eggs. The hormones in OCPs also cause the cervical mucus to thicken, preventing the sperm from entering the uterus. The hormones also cause the uterine lining to become thin, not allowing a fertilized egg to attach to the inside of the uterus. OCPs are highly effective when taken exactly as prescribed. However, OCPs do not prevent sexually transmitted diseases (STDs). Safe sex practices, such as using condoms along with the pill, can help prevent STDs. Before taking the pill, you may have a physical exam and Pap test. Your health care provider may order blood tests. The health care provider will make sure you are a good candidate for oral contraception. Discuss with your health care provider the possible side effects of the OCP you may be prescribed. When starting an OCP, it can take 2 to 3 months for the body to adjust to the changes in hormone levels in your body. Types of oral contraception  The combination pill-This pill contains estrogen and progestin (synthetic progesterone) hormones. The combination pill comes in 21-day, 28-day, or 91-day packs. Some types of combination pills are meant to be taken continuously (365-day pills). With 21-day packs, you do not take pills for 7 days after the last pill. With 28-day packs, the pill is taken every day. The last 7 pills are without hormones. Certain types of pills have more than 21 hormone-containing pills. With 91-day packs, the first 84 pills contain both hormones, and the last 7 pills contain no hormones or contain estrogen only.  The minipill-This pill contains the progesterone hormone only. The pill is taken every day continuously. It is very important to take the pill at the same time each day. The minipill comes in packs of 28 pills. All 28 pills contain the hormone. Advantages of oral  contraceptive pills  Decreases premenstrual symptoms.  Treats menstrual period cramps.  Regulates the menstrual cycle.  Decreases a heavy menstrual flow.  May treatacne, depending on the type of pill.  Treats abnormal uterine bleeding.  Treats polycystic ovarian syndrome.  Treats endometriosis.  Can be used as emergency contraception. Things that can make oral contraceptive pills less effective OCPs can be less effective if:  You forget to take the pill at the same time every day.  You have a stomach or intestinal disease that lessens the absorption of the pill.  You take OCPs with other medicines that make OCPs less effective, such as antibiotics, certain HIV medicines, and some seizure medicines.  You take expired OCPs.  You forget to restart the pill on day 7, when using the packs of 21 pills.  Risks associated with oral contraceptive pills Oral contraceptive pills can sometimes cause side effects, such as:  Headache.  Nausea.  Breast tenderness.  Irregular bleeding or spotting.  Combination pills are also associated with a small increased risk of:  Blood clots.  Heart attack.  Stroke.  This information is not intended to replace advice given to you by your health care provider. Make sure you discuss any questions you have with your health care provider. Document Released: 11/07/2002 Document Revised: 01/23/2016 Document Reviewed: 02/05/2013 Elsevier Interactive Patient Education  2018 Elsevier Inc.  

## 2018-02-07 ENCOUNTER — Other Ambulatory Visit: Payer: Self-pay | Admitting: Family Medicine

## 2018-02-07 DIAGNOSIS — Z30011 Encounter for initial prescription of contraceptive pills: Secondary | ICD-10-CM

## 2019-01-09 ENCOUNTER — Other Ambulatory Visit: Payer: Self-pay | Admitting: Family Medicine

## 2019-01-09 DIAGNOSIS — Z30011 Encounter for initial prescription of contraceptive pills: Secondary | ICD-10-CM

## 2019-06-19 ENCOUNTER — Other Ambulatory Visit: Payer: Self-pay

## 2019-06-19 ENCOUNTER — Encounter: Payer: Self-pay | Admitting: Family Medicine

## 2019-06-19 ENCOUNTER — Ambulatory Visit (INDEPENDENT_AMBULATORY_CARE_PROVIDER_SITE_OTHER): Payer: Self-pay | Admitting: Family Medicine

## 2019-06-19 VITALS — BP 102/60 | HR 84 | Temp 98.7°F | Ht 63.0 in | Wt 114.8 lb

## 2019-06-19 DIAGNOSIS — L249 Irritant contact dermatitis, unspecified cause: Secondary | ICD-10-CM

## 2019-06-19 MED ORDER — TRIAMCINOLONE ACETONIDE 0.1 % EX CREA
1.0000 | TOPICAL_CREAM | Freq: Two times a day (BID) | CUTANEOUS | 0 refills | Status: DC
Start: 2019-06-19 — End: 2019-12-18

## 2019-06-19 NOTE — Patient Instructions (Signed)
Good to see you today  Take a daily over the counter antihistamine for 7-10 days, use the cream for no more than 10 days.   Follow up if worsening, or if no improvement with treatment.

## 2019-06-19 NOTE — Progress Notes (Signed)
Subjective:    Patient ID: Heidi Orr, female    DOB: 1995-10-13, 23 y.o.   MRN: [id number]  HPI Chief Complaint  Patient presents with  . Poison [place]    left inner upper arm. used calamine lotion, seemed to help with left arm but rash has now spread across chest to right arm and up neck.    This is a 23 yo female who presents today with above cc. First noticed about 10 days ago, thought it was a bug bite. Went away. A couple of days later, was on inner arm. Has spread on left breast, right arm. Some improvement with calamine lotion. Is itchy. Has not taken any oral medications. Boyfriend does landscaping, ? Contact with poisonous plants. No new soaps, shampoo, lotion, detergent. No travel.   Past Medical History:  Diagnosis Date  . Medical history non-contributory   . Syncope 2009  . UTI (lower urinary tract infection)    Past Surgical History:  Procedure Laterality Date  . TOOTH EXTRACTION     History reviewed. No pertinent family history. Social History   Tobacco Use  . Smoking status: Never Smoker  . Smokeless tobacco: Never Used  Substance Use Topics  . Alcohol use: No    Alcohol/week: 0.0 standard drinks    Comment: very little, not while pregnant  . Drug use: No      Review of Systems Per HPI    Objective:   Physical Exam Vitals signs reviewed.  Constitutional:      General: She is not in acute distress.    Appearance: Normal appearance. She is normal weight. She is not ill-appearing, toxic-appearing or diaphoretic.  HENT:     Head: Normocephalic and atraumatic.  Eyes:     Conjunctiva/sclera: Conjunctivae normal.  Cardiovascular:     Rate and Rhythm: Normal rate.  Pulmonary:     Effort: Pulmonary effort is normal.  Skin:    General: Skin is warm and dry.     Comments: Left inner, upper arm with drying, patchy, slightly raised areas approximately 1 cm in diameter. Few similar, slightly more mottled, confluent appearing lesions on lateral side of  left breast. Also with several smaller, slightly raised, erythematous lesions on left inner, upper arm.   Neurological:     Mental Status: She is alert and oriented to person, place, and time.  Psychiatric:        Mood and Affect: Mood normal.        Behavior: Behavior normal.        Thought Content: Thought content normal.        Judgment: Judgment normal.          BP 102/60 (BP Location: Right Arm, Patient Position: Sitting, Cuff Size: Normal)   Pulse 84   Temp 98.7 F (37.1 C) (Temporal)   Ht 5\' 3"  (1.6 m)   Wt 114 lb 12.8 oz (52.1 kg)   SpO2 99%   BMI 20.34 kg/m  Wt Readings from Last 3 Encounters:  06/19/19 114 lb 12.8 oz (52.1 kg)  03/01/17 112 lb 8 oz (51 kg)  02/01/17 114 lb 8 oz (51.9 kg)    Assessment & Plan:  1. Irritant contact dermatitis, unspecified trigger -Provided written and verbal information regarding diagnosis and treatment. -  Patient Instructions  Good to see you today  Take a daily over the counter antihistamine for 7-10 days, use the cream for no more than 10 days.   Follow up if worsening, or if  no improvement with treatment.    - triamcinolone cream (KENALOG) 0.1 %; Apply 1 application topically 2 (two) times daily.  Dispense: 30 g; Refill: 0   Clarene Reamer, FNP-BC  [facility]  06/19/2019 2:44 PM

## 2019-09-01 NOTE — L&D Delivery Note (Signed)
OB/GYN Faculty Practice Delivery Note  Heidi Orr is a 24 y.o. [id number] s/p NSVD at [redacted]w[redacted]d. She was admitted for spontaneous labor.   ROM: 4h 60m with clear fluid GBS Status: Negative/-- (06/09 1111) Maximum Maternal Temperature: 98.2 F    Labor Progress: . Patient arrived at 8-9 cm dilation and progressed to full dilation spontaneously. She had a prolonged mild bradycardia into the 80-90's for approximately 9 minutes. She was verbally consented for a vacuum delivery but achieved NSVD prior to that becoming necessary.   Delivery Date/Time: 02/26/2020 at 1151 Delivery: Called to room and patient was complete and pushing. Head delivered in OA position. No nuchal cord present. Shoulder and body delivered in usual fashion. Infant with spontaneous cry, placed on mother's abdomen, dried and stimulated. Cord clamped x 2 after 1-minute delay, and cut by FOB. Cord blood drawn. Placenta delivered spontaneously with gentle cord traction. Fundus firm with massage and Pitocin. Labia, perineum, vagina, and cervix inspected with hemostatic periurethral laceration that was not repaired.   Placenta: 3v intact to L&D Complications: none Lacerations: hemostatic periurethral laceration that was not repaired EBL: 50 cc Analgesia: none   Infant: APGAR (1 MIN): 9   APGAR (5 MINS): 9    Weight: 3476 grams  Zack Seal, MD/MPH OB/GYN Fellow, Faculty Practice

## 2019-12-18 ENCOUNTER — Ambulatory Visit (INDEPENDENT_AMBULATORY_CARE_PROVIDER_SITE_OTHER): Payer: Medicaid Other | Admitting: Obstetrics & Gynecology

## 2019-12-18 ENCOUNTER — Other Ambulatory Visit: Payer: Self-pay

## 2019-12-18 ENCOUNTER — Other Ambulatory Visit (HOSPITAL_COMMUNITY)
Admission: RE | Admit: 2019-12-18 | Discharge: 2019-12-18 | Disposition: A | Discharge status: Home / Self Care | Payer: Medicaid Other | Source: Ambulatory Visit | Attending: Obstetrics & Gynecology | Admitting: Obstetrics & Gynecology

## 2019-12-18 ENCOUNTER — Encounter: Payer: Self-pay | Admitting: Obstetrics & Gynecology

## 2019-12-18 ENCOUNTER — Encounter: Payer: Self-pay | Admitting: *Deleted

## 2019-12-18 VITALS — BP 118/68 | HR 93 | Wt 132.9 lb

## 2019-12-18 DIAGNOSIS — Z23 Encounter for immunization: Secondary | ICD-10-CM

## 2019-12-18 DIAGNOSIS — O0993 Supervision of high risk pregnancy, unspecified, third trimester: Secondary | ICD-10-CM

## 2019-12-18 DIAGNOSIS — O0933 Supervision of pregnancy with insufficient antenatal care, third trimester: Secondary | ICD-10-CM | POA: Diagnosis not present

## 2019-12-18 DIAGNOSIS — O0990 Supervision of high risk pregnancy, unspecified, unspecified trimester: Secondary | ICD-10-CM | POA: Insufficient documentation

## 2019-12-18 DIAGNOSIS — Z3A28 28 weeks gestation of pregnancy: Secondary | ICD-10-CM | POA: Diagnosis not present

## 2019-12-18 MED ORDER — BLOOD PRESSURE KIT DEVI: 1.0000 | Freq: Once | 0 refills | Status: AC

## 2019-12-18 NOTE — Progress Notes (Signed)
  Subjective:    Heidi Orr is a G2P1001 [redacted]w[redacted]d being seen today for her first obstetrical visit.  Her obstetrical history is significant for late prenatal care. Patient does intend to breast feed. Pregnancy history fully reviewed.  Patient reports no complaints.  Vitals:   12/18/19 1405  BP: 118/68  Pulse: 93  Weight: 132 lb 14.4 oz (60.3 kg)    HISTORY: OB History  Gravida Para Term Preterm AB Living  2 1 1     1   SAB TAB Ectopic Multiple Live Births        0 1    # Outcome Date GA Lbr Len/2nd Weight Sex Delivery Anes PTL Lv  2 Current           1 Term 08/11/16 [redacted]w[redacted]d 09:01 / 07:01 6 lb 12.5 oz (3.076 kg) M Vag-Spont EPI  LIV     Birth Comments: caput   Past Medical History:  Diagnosis Date  . Medical history non-contributory   . Syncope 2009  . UTI (lower urinary tract infection)    Past Surgical History:  Procedure Laterality Date  . TOOTH EXTRACTION     History reviewed. No pertinent family history.   Exam    Uterus:   28 cm FH  Pelvic Exam:    Perineum: Hemorrhoids   Vulva: normal   Vagina:  normal mucosa   pH:     Cervix: no lesions   Adnexa: not evaluated   Bony Pelvis: average  System: Breast:  normal appearance, no masses or tenderness, Normal to palpation without dominant masses   Skin: normal coloration and turgor, no rashes    Neurologic: oriented, normal mood   Extremities: normal strength, tone, and muscle mass   HEENT PERRLA and neck supple with midline trachea   Mouth/Teeth dental hygiene good   Neck supple and no masses   Cardiovascular: regular rate and rhythm, no murmurs or gallops   Respiratory:  appears well, vitals normal, no respiratory distress, acyanotic, normal RR, neck free of mass or lymphadenopathy, chest clear, no wheezing, crepitations, rhonchi, normal symmetric air entry   Abdomen: gravid   Urinary: urethral meatus normal      Assessment:    Pregnancy: G2P1001 Patient Active Problem List   Diagnosis Date Noted   . Supervision of high risk pregnancy, antepartum 12/18/2019  . DUB (dysfunctional uterine bleeding) 07/12/2015  . Abnormal weight loss 01/26/2014  . IBS (irritable bowel syndrome) 01/10/2014        Plan:     Initial labs drawn. Prenatal vitamins. Problem list reviewed and updated. Genetic Screening discussed.  Ultrasound discussed; fetal survey: ordered.  Follow up in 2 weeks. 50% of 30 min visit spent on counseling and coordination of care.  May have 1 hr GTT today or RTC for 2 hr   01/12/2014 12/18/2019

## 2019-12-18 NOTE — Patient Instructions (Signed)
AREA PEDIATRIC/FAMILY PRACTICE PHYSICIANS  [place] ([address]) . [facility] o Chambliss, MD; Eniola, MD; Hale, MD; Hensel, MD; McDiarmid, MD; McIntyer, MD; Neal, MD; Walden, MD o [address] [place], [place] [address] o [phone] o Mon-Fri 8:30-12:30, 1:30-5:00 o Providers come to see babies at [facility] o Accepting Medicaid . [facility] o Limited providers who accept newborns: Koirala, MD; Morrow, MD; Wolters, MD o [address], [place], [place] [address] o [phone] o Mon-Fri 8:00-5:30 o Babies seen by providers at [facility] o Does NOT accept Medicaid o Please call early in hospitalization for appointment (limited availability)  . [facility] o Mulberry, MD o [address] [place], [place] [address] o [phone] o Mon, Tue, Thur, Fri 8:30-5:00, Wed 10:00-7:00 (closed 1-2pm) o Babies seen by [facility] providers o Accepting Medicaid . [facility] o Rubin, MD o [address], [place], [place] [address] o [phone] o Mon-Fri 8:30-5:00, Sat 8:30-12:00 o Provider comes to see babies at [facility] o Accepting Medicaid o Must have been referred from current patients or contacted office prior to delivery . [facility] ([facility]) o Brown, MD; Chandler, MD; Ettefagh, MD; Grant, MD; Lester, MD; McCormick, MD; McQueen, MD; Prose, MD; Simha, MD; Stanley, MD; Stryffeler, NP; Tebben, NP o [address], [place], [place] [address] o [phone] o Mon, Tue, Thur, Fri 8:30-5:30, Wed 9:30-5:30, Sat 8:30-12:30 o Babies seen by [facility] providers o Accepting Medicaid o Only accepting infants of first-time parents or siblings of current patients o Hospital discharge coordinator will make follow-up appointment . Jack Amos o [address],  [place], [place]  [address] o [phone]   Fax - [phone] . [facility] o [address], [place], [place]  [address] o Phone - [phone]   Fax - [phone] . Shilpa Gosrani o [address], [place], [place]  [address] o [phone]  [place] ([address]) . [facility] o Bates, MD; [place], MD; Cooper, Cox, MD; MD; Davis, MD; Dovico, MD; Ettefaugh, MD; Little, MD; Lowe, MD; Keiffer, MD; Melvin, MD; Sumner, MD; Williams, MD o [address], [place], [place] [address] o [phone] o Mon-Fri 8:30-5:00 (extended evenings Mon-Thur as needed), Sat-Sun 10:00-1:00 o Providers come to see babies at [facility] o Accepting Medicaid for families of first-time babies and families with all children in the household age 3 and under. Must register with office prior to making appointment (M-F only). . [facility] o Henson, NP; Knapp, MD; Lalonde, MD; Tysinger, PA o [address] [place], [place] [address] o [phone] o Mon-Fri 8:00-5:00 o Babies seen by providers at [facility] o Does NOT accept Medicaid/Commercial Insurance Only . [facility] ([facility])  o Artis, MD; Barnes, MD; Bratton, MD; Coccaro, MD; Lockett Gardner, MD; Kramer, MD; Marshall, MD; Netherton, MD; Poleto, MD; Skinner, MD o [address] [place], [place] [address] o [phone] o Mon-Fri 8:30-5:30, Sat (Oct.-Mar.) 9:00-1:00 o Babies seen by providers at [facility] o Accepting Medicaid  [place] ([address]) . [facility] o Reid, MD; Warner, MD o [address], [place],  [address] o [phone] o Mon-Fri 8:30-5:00, Sat 8:30-12:00 o Providers come to see babies at [facility] o Does NOT accept Medicaid . [facility] o Becker, PA; Hagler, MD; Scifres, PA; Sun, MD; Swayne, MD o [address],  [place], [place] [address] o [phone] o Mon-Fri 8:00-5:00 o Babies seen by providers at [facility] o Does NOT accept Medicaid o Only accepting babies of parents who are patients o Please call early in hospitalization for appointment (limited availability) . [facility] o Clark, MD; Frye, MD; Kelleher, MD; Mack, NP; Miller, MD; O'Keller, MD; Patterson, NP; Pudlo, MD; Puzio, MD; Thomas, MD; Tucker, MD; Twiselton, MD o [address], [place], [place] [address] o [phone] o Mon-Fri 8:00-5:00, Sat 9:00-12:00 o Providers come to see babies at [facility] o Does NOT accept Medicaid  [place] ([address]) . [facility] o Limited providers accepting new patients: Brake, NP; Wharton, PA o [address], [place], [place] [address] o [phone] o Mon-Fri 8:00-5:00 o Babies seen by providers at [facility] o Does NOT accept Medicaid o Only accepting babies of parents who are patients o Please call early in hospitalization for appointment (limited availability) . [facility] o Gay, MD; Quinlan, MD o [address] [place], [place] [address] o [phone] (press [phone] to schedule appointment) o Mon-Fri 8:00-5:00 o Providers come to see babies at [facility] o Does NOT accept Medicaid . [facility] o Mazer, MD o [address] [place], [place] [address] o [phone] o Mon-Fri 8:30-5:00 (lunch 12:30-1:00), extended hours by appointment only Wed 5:00-6:30 o Babies seen by [facility] providers o Accepting Medicaid . [facility] o Banks, MD; Jordan, MD; Koberlein, MD o [address], [place], [place] [address] o [phone] o Mon-Fri 8:00-5:00 o Babies seen by [facility] providers o Does NOT accept Medicaid . [facility] o Parker, MD; Hunter, MD; Wallace, DO o [address] [place], [place]  [address] o [phone] o Mon-Fri 8:00-5:00 o Babies seen by [facility] providers o Does NOT accept Medicaid . [facility] o Brandon, PA; Brecken, PA; Christy, NP; Dees, MD; DeClaire, MD; DeWeese, MD; Hansen, NP; Mills, NP; Parrish, NP; Smoot, NP; Summer, MD; Vapne, MD o [address] [place], [place] [address] o [phone] o Mon-Fri 8:30-5:00, Sat 10:00-1:00 o Providers come to see babies at [facility] o Does NOT accept Medicaid o Free prenatal information session Tuesdays at 4:45pm . [facility] o Bouska, MD; Gordon, PA; Jeffery, PA; Weber, PA o [address] [place] [address] o [phone] o Mon-Fri 7:30-5:30 o Babies seen by [facility] providers . [facility] o [address], [place], [place]  [address] o [phone]   Fax - [phone]  [place] ([address]) . [facility] o [place], [place] o [address] [place], [place] [address] o [phone] o Mon-Thur 8:00-6:00 o Providers come to see babies at [facility] o Accepting Medicaid . [facility] o Anderson, NP; Badger, MD; Beal, PA; Spencer, PA o [address] [place], [place] [address] o [phone] o Mon-Thur 7:30-7:30, Fri 7:30-4:30 o Babies seen by [facility] providers o Accepting Medicaid . [facility] o Agbuya, MD; Klett, NP; Romgoolam, MD o [address], [place], [place] [address] o [phone] o Mon-Fri 8:30-5:00, Sat 8:30-12:00 o Providers come to see babies at [facility] o Accepting Medicaid o Must have "Meet & Greet" appointment at office prior to delivery . [facility]  ([facility] o McCord,   MD; Wallace, MD; Wood, MD o [address], [place], [place] [address] o [phone] o Mon-Wed 8:00-6:00, Thur-Fri 8:00-5:00, Sat 9:00-12:00 o Providers come to  see babies at [facility] o Does NOT accept Medicaid o Only accepting siblings of current patients . [facility]  o [address], [place], [place]  [address] o [phone]   Fax - [phone] . [facility] o [address], [place], [place]  [address] o [phone]   Fax - [phone]  [place] ([address] & [address]) . [facility] o Cirigliano, DO; Matthews, DO o [address] [place], [place] [address] o [phone] o Mon-Fri 7:00-5:00 o Babies seen by [facility] providers o Does NOT accept Medicaid . [facility] o Briscoe, MD; Howley, PA; Moreira, PA o [address], [place], [place] [address] o [phone] o Mon-Fri 8:00-5:00 o Babies seen by [facility] providers o Accepting Medicaid . [facility] o Boyd, MD; Church, PA; Jones, NP; Osborn, PA o [address], , [place] [address] o [phone] o Mon-Fri 8:00-5:00 o Babies seen by providers at [facility] o Accepting Medicaid  [place] ([address]) . [facility] [place] o Wendling, DO o [address] [place], [place] [address] o [phone] o Mon-Fri 8:00-5:00 o Babies seen by [facility] providers o Does NOT accept Medicaid o Limited availability, please call early in hospitalization to schedule follow-up . [facility] o Calderon, PA; Cummings, MD; Dillard, MD; Martin, PA; Olson, MD; VanDeven, PA o [address], [place], [place] [address] o [phone] o Mon-Fri 8:30-5:00, Sat 9:00-12:00 o Babies seen by providers at [facility] o Accepting Medicaid o Please register online then schedule online or call office o [email or web address] . [facility] o Hunter, NP; Kumar, MD; Martin Rogers, PA o [address], [place], [place] [address] o [phone] o Mon-Fri 8:00-5:00 o Babies seen by providers at [facility] o Accepting Medicaid . [facility] ([facility] o Lapwai, MD; Kristi Fleenor, NP; West, MD o [address], [place], [place] [address] o [phone] o Mon-Fri 8:00-5:30, Sat&Sun by appointment (phones open at 8:30) o Babies seen by [facility] providers o Accepting Medicaid o Must be a first-time baby or sibling of current patient . [facility] [place]  o [address], [place], [place]  [address] o [phone]   Fax - [phone]  [place] ([address]) . [facility] o Brown, PA; Cowen, PA; Rice, MD; Helton, PA; Spry, MD o [address] [place], [place] [address] o [phone] o Mon-Thur 8:00-7:00, Fri 8:00-5:00, Sat 8:00-12:00, Sun 9:00-12:00 o Babies seen by [facility] providers o Accepting Medicaid . [facility] o Coe-Goins, MD; Marshall, MD; Pierre-Louis, MD o [address], [place], [place] [address] o [phone] o Mon-Thur 8:00-5:00 o Babies seen by providers at [facility] o Accepting Medicaid . [facility] o Bratton, MD; Coe-Goins, MD; Hayes, MD; Lewis, MD; List, MD; Lott, MD; Marshall, MD; Moran, MD; O'Neal, MD; Pierre-Louis, MD; Pitonzo, MD; Scholer, MD; Spangle, MD o [address] [place],    [address] o [phone] o Mon-Fri 8:00-5:30, Sat (Oct.-Mar.) 9:00-1:00 o Babies seen by providers at [facility] o Accepting Medicaid o Must fill out new patient packet, available online at [email or web address] . [facility] ([facility] o Friddle, NP; Harris, NP; Kelly, NP; [place], MD;  Melvin, PA; Poth, MD; Ramadoss, MD; [place], NP o [address], [place], [place] [address] o [phone] o Mon-Thur 8:00-5:30, Fri 8:00-5:00 o Babies seen by providers at [facility] o Accepting Medicaid  [place] ([address]) . [facility] o Dixon, PA; [place], MD; Pickard, MD; Tapia, PA o [address], [place], [place] [address] o [phone] o Mon-Fri 8:00-5:00 o Babies seen by providers at [facility] o Accepting Medicaid   [place] ([address]) . [facility] [place] o Masneri, DO; Meyers, MD; Nelson, PA o [address], [place], [place] [address] o [phone] o Mon-Fri 8:00-5:00 o Babies seen by providers at [facility] o Does NOT accept Medicaid o Limited appointment availability, please call early in hospitalization  . [facility] [place] o Kunedd, DO; McGowen, MD o [address], [place], [place] [address] o [phone] o Mon-Fri 8:00-5:00 o Babies seen by [facility] providers o Does NOT accept Medicaid . [facility] [place] o Cameron, MD; MacDonald, MD; Michaels, PA; Nayak, MD o [address] [place] [address], [place], [place] [address] o [phone] o Mon-Fri 8:00-5:00 o After hours clinic ([address] , [place] [address]) [phone] Mon-Fri 5:00-8:00, Sat 12:00-6:00, Sun 10:00-4:00 o Babies seen by [facility] providers o Accepting Medicaid . [facility] [place] o [address], [place], [place]  [address] o [phone]   Fax - [phone]  [place] ([address]) . [facility] [place] [facility] o Andy, MD o [address], [place], [place] [address] o [phone] o Mon-Fri 8:00-5:00 o Babies seen by [facility] providers o Does NOT accept Medicaid . [facility] [place] ([facility] [place]) o Eksir, MD o [address], [place], [place]  [address] o [phone] o Mon-Thur 8:00-7:00, Fri 8:00-5:00, Sat 8:00-12:00 o Babies seen by providers at [facility] o Accepting Medicaid - but does not have vaccinations in office (must be received elsewhere) o Limited availability, please call early in hospitalization  [place] ([address]) . [facility]  o Charlene Flemming, MD o [address], [place]  [address] o [phone]  Fax [phone]   

## 2019-12-19 ENCOUNTER — Encounter: Payer: Self-pay | Admitting: *Deleted

## 2019-12-19 LAB — POCT URINALYSIS DIP (DEVICE)
Bilirubin Urine: NEGATIVE
Glucose, UA: 100 mg/dL — AB
Hgb urine dipstick: NEGATIVE
Ketones, ur: NEGATIVE mg/dL
Nitrite: NEGATIVE
Protein, ur: NEGATIVE mg/dL
Specific Gravity, Urine: >=1.03 (ref 1.005–1.030)
Urobilinogen, UA: 0.2 mg/dL (ref 0.0–1.0)
pH: 7 (ref 5.0–8.0)

## 2019-12-19 LAB — GLUCOSE TOLERANCE, 1 HOUR: Glucose, 1Hr PP: 87 mg/dL (ref 65–199)

## 2019-12-20 LAB — CULTURE, OB URINE

## 2019-12-20 LAB — OBSTETRIC PANEL, INCLUDING HIV
Antibody Screen: NEGATIVE
Basophils Absolute: 0 10*3/uL (ref 0.0–0.2)
Basos: 0 %
EOS (ABSOLUTE): 0 10*3/uL (ref 0.0–0.4)
Eos: 0 %
HIV Screen 4th Generation wRfx: NONREACTIVE
Hematocrit: 34 % (ref 34.0–46.6)
Hemoglobin: 11.4 g/dL (ref 11.1–15.9)
Hepatitis B Surface Ag: NEGATIVE
Immature Grans (Abs): 0.1 10*3/uL (ref 0.0–0.1)
Immature Granulocytes: 1 %
Lymphocytes Absolute: 1.6 10*3/uL (ref 0.7–3.1)
Lymphs: 17 %
MCH: 29.2 pg (ref 26.6–33.0)
MCHC: 33.5 g/dL (ref 31.5–35.7)
MCV: 87 fL (ref 79–97)
Monocytes Absolute: 0.8 10*3/uL (ref 0.1–0.9)
Monocytes: 9 %
Neutrophils Absolute: 6.7 10*3/uL (ref 1.4–7.0)
Neutrophils: 73 %
Platelets: 284 10*3/uL (ref 150–450)
RBC: 3.9 x10E6/uL (ref 3.77–5.28)
RDW: 12.6 % (ref 11.7–15.4)
RPR Ser Ql: NONREACTIVE
Rh Factor: POSITIVE
Rubella Antibodies, IGG: 1.21 index (ref >0.99)
WBC: 9.2 10*3/uL (ref 3.4–10.8)

## 2019-12-20 LAB — URINE CULTURE, OB REFLEX

## 2019-12-21 LAB — CYTOLOGY - PAP
Chlamydia: NEGATIVE
Comment: NEGATIVE
Comment: NEGATIVE
Comment: NORMAL
Diagnosis: UNDETERMINED — AB
High risk HPV: NEGATIVE
Neisseria Gonorrhea: NEGATIVE

## 2019-12-26 ENCOUNTER — Encounter: Payer: Self-pay | Admitting: *Deleted

## 2019-12-27 ENCOUNTER — Encounter: Payer: Self-pay | Admitting: *Deleted

## 2020-01-01 ENCOUNTER — Encounter: Payer: Self-pay | Admitting: Student

## 2020-01-02 ENCOUNTER — Telehealth (INDEPENDENT_AMBULATORY_CARE_PROVIDER_SITE_OTHER): Payer: Medicaid Other | Admitting: Student

## 2020-01-02 ENCOUNTER — Other Ambulatory Visit: Payer: Self-pay

## 2020-01-02 DIAGNOSIS — O0990 Supervision of high risk pregnancy, unspecified, unspecified trimester: Secondary | ICD-10-CM

## 2020-01-02 NOTE — Progress Notes (Signed)
1:34p- Called pt for virtual visit, no answer & mail box is full, not able to leave a message.   1:46p- 2nd attempt, still no answer,mail box full, cannot leave message. Pt will need to reschedule.

## 2020-01-02 NOTE — Progress Notes (Signed)
Patient ID: Heidi Orr, female   DOB: 01-11-1996, 24 y.o.   MRN: [id number] Patient did not attend My Chart visit. She will need to be rescheduled.

## 2020-01-08 ENCOUNTER — Encounter: Payer: Self-pay | Admitting: *Deleted

## 2020-01-11 ENCOUNTER — Ambulatory Visit (INDEPENDENT_AMBULATORY_CARE_PROVIDER_SITE_OTHER): Payer: Medicaid Other | Admitting: Family Medicine

## 2020-01-11 ENCOUNTER — Other Ambulatory Visit: Payer: Self-pay

## 2020-01-11 DIAGNOSIS — O0990 Supervision of high risk pregnancy, unspecified, unspecified trimester: Secondary | ICD-10-CM

## 2020-01-11 DIAGNOSIS — Z3483 Encounter for supervision of other normal pregnancy, third trimester: Secondary | ICD-10-CM

## 2020-01-11 DIAGNOSIS — Z3A32 32 weeks gestation of pregnancy: Secondary | ICD-10-CM

## 2020-01-11 NOTE — Progress Notes (Signed)
   PRENATAL VISIT NOTE  Subjective:  Heidi Orr is a 24 y.o. G2P1001 at [redacted]w[redacted]d being seen today for ongoing prenatal care.  She is currently monitored for the following issues for this low-risk pregnancy and has IBS (irritable bowel syndrome); Abnormal weight loss; DUB (dysfunctional uterine bleeding); and Supervision of high risk pregnancy, antepartum on their problem list.  Patient reports no complaints.  Contractions: Not present. Vag. Bleeding: None.  Movement: Present. Denies leaking of fluid.   The following portions of the patient's history were reviewed and updated as appropriate: allergies, current medications, past family history, past medical history, past social history, past surgical history and problem list.   Objective:   Vitals:   01/11/20 1501  BP: 117/68  Pulse: 85  Weight: 136 lb (61.7 kg)    Fetal Status: Fetal Heart Rate (bpm): 138 Fundal Height: 28 cm Movement: Present     General:  Alert, oriented and cooperative. Patient is in no acute distress.  Skin: Skin is warm and dry. No rash noted.   Cardiovascular: Normal heart rate noted  Respiratory: Normal respiratory effort, no problems with respiration noted  Abdomen: Soft, gravid, appropriate for gestational age.  Pain/Pressure: Present     Pelvic: Cervical exam deferred        Extremities: Normal range of motion.  Edema: None  Mental Status: Normal mood and affect. Normal behavior. Normal judgment and thought content.   Assessment and Plan:  Pregnancy: G2P1001 at [redacted]w[redacted]d 1. Supervision of low risk pregnancy, antepartum Continue routine prenatal care.   Preterm labor symptoms and general obstetric precautions including but not limited to vaginal bleeding, contractions, leaking of fluid and fetal movement were reviewed in detail with the patient. Please refer to After Visit Summary for other counseling recommendations.   Return in 2 weeks (on 01/25/2020) for in person, needs U/S.  Future Appointments    Date Time Provider Department Center  01/24/2020  9:45 AM [facility] US4 [facility]  01/25/2020 10:15 AM Rasch, Harolyn Rutherford, NP [facility]    Reva Bores, MD

## 2020-01-11 NOTE — Patient Instructions (Signed)

## 2020-01-24 ENCOUNTER — Other Ambulatory Visit: Payer: Self-pay | Admitting: *Deleted

## 2020-01-24 ENCOUNTER — Other Ambulatory Visit: Payer: Self-pay

## 2020-01-24 ENCOUNTER — Other Ambulatory Visit: Payer: Self-pay | Admitting: Obstetrics & Gynecology

## 2020-01-24 ENCOUNTER — Ambulatory Visit: Payer: Medicaid Other | Attending: Obstetrics and Gynecology

## 2020-01-24 DIAGNOSIS — Z3A33 33 weeks gestation of pregnancy: Secondary | ICD-10-CM

## 2020-01-24 DIAGNOSIS — O0933 Supervision of pregnancy with insufficient antenatal care, third trimester: Secondary | ICD-10-CM

## 2020-01-24 DIAGNOSIS — O0990 Supervision of high risk pregnancy, unspecified, unspecified trimester: Secondary | ICD-10-CM | POA: Diagnosis present

## 2020-01-24 DIAGNOSIS — Z3493 Encounter for supervision of normal pregnancy, unspecified, third trimester: Secondary | ICD-10-CM

## 2020-01-24 DIAGNOSIS — Z363 Encounter for antenatal screening for malformations: Secondary | ICD-10-CM | POA: Diagnosis not present

## 2020-01-25 ENCOUNTER — Ambulatory Visit (INDEPENDENT_AMBULATORY_CARE_PROVIDER_SITE_OTHER): Payer: Medicaid Other | Admitting: Obstetrics and Gynecology

## 2020-01-25 DIAGNOSIS — Z3A34 34 weeks gestation of pregnancy: Secondary | ICD-10-CM

## 2020-01-25 DIAGNOSIS — O0990 Supervision of high risk pregnancy, unspecified, unspecified trimester: Secondary | ICD-10-CM

## 2020-01-25 DIAGNOSIS — O0993 Supervision of high risk pregnancy, unspecified, third trimester: Secondary | ICD-10-CM

## 2020-01-25 NOTE — Progress Notes (Signed)
   PRENATAL VISIT NOTE  Subjective:  Heidi Orr is a 24 y.o. G2P1001 at [redacted]w[redacted]d being seen today for ongoing prenatal care.  She is currently monitored for the following issues for this low-risk pregnancy and has IBS (irritable bowel syndrome); Abnormal weight loss; DUB (dysfunctional uterine bleeding); Supervision of normal pregnancy, antepartum; and Supervision of high risk pregnancy, antepartum on their problem list.  Patient reports no complaints.  Contractions: Not present. Vag. Bleeding: None.  Movement: Present. Denies leaking of fluid.   The following portions of the patient's history were reviewed and updated as appropriate: allergies, current medications, past family history, past medical history, past social history, past surgical history and problem list.   Objective:   Vitals:   01/25/20 1023  BP: 131/80  Pulse: 78  Weight: 136 lb (61.7 kg)    Fetal Status: Fetal Heart Rate (bpm): 143   Movement: Present     General:  Alert, oriented and cooperative. Patient is in no acute distress.  Skin: Skin is warm and dry. No rash noted.   Cardiovascular: Normal heart rate noted  Respiratory: Normal respiratory effort, no problems with respiration noted  Abdomen: Soft, gravid, appropriate for gestational age.  Pain/Pressure: Present     Pelvic: Cervical exam deferred        Extremities: Normal range of motion.  Edema: None  Mental Status: Normal mood and affect. Normal behavior. Normal judgment and thought content.   Assessment and Plan:  Pregnancy: G2P1001 at [redacted]w[redacted]d 1. Supervision of high risk pregnancy, antepartum  Repeat US in 4 weeks for growth per MFM  Late to care, 1 hour GTT normal  BP good today.  No complaints    Preterm labor symptoms and general obstetric precautions including but not limited to vaginal bleeding, contractions, leaking of fluid and fetal movement were reviewed in detail with the patient. Please refer to After Visit Summary for other counseling  recommendations.   No follow-ups on file.  Future Appointments  Date Time Provider Department Center  02/07/2020 10:35 AM Mcneil Sober [facility]  02/21/2020 11:30 AM [facility] US3 [facility]    Venia Carbon, NP

## 2020-02-07 ENCOUNTER — Encounter: Payer: Self-pay | Admitting: Certified Nurse Midwife

## 2020-02-07 ENCOUNTER — Other Ambulatory Visit: Payer: Self-pay

## 2020-02-07 ENCOUNTER — Ambulatory Visit (INDEPENDENT_AMBULATORY_CARE_PROVIDER_SITE_OTHER): Payer: Medicaid Other | Admitting: Certified Nurse Midwife

## 2020-02-07 ENCOUNTER — Other Ambulatory Visit (HOSPITAL_COMMUNITY)
Admission: RE | Admit: 2020-02-07 | Discharge: 2020-02-07 | Disposition: A | Discharge status: Home / Self Care | Payer: Medicaid Other | Source: Ambulatory Visit | Attending: Certified Nurse Midwife | Admitting: Certified Nurse Midwife

## 2020-02-07 VITALS — BP 130/77 | HR 73 | Wt 141.0 lb

## 2020-02-07 DIAGNOSIS — Z3A35 35 weeks gestation of pregnancy: Secondary | ICD-10-CM

## 2020-02-07 DIAGNOSIS — O0990 Supervision of high risk pregnancy, unspecified, unspecified trimester: Secondary | ICD-10-CM | POA: Diagnosis present

## 2020-02-07 DIAGNOSIS — O0993 Supervision of high risk pregnancy, unspecified, third trimester: Secondary | ICD-10-CM

## 2020-02-07 NOTE — Patient Instructions (Signed)
Group B Streptococcus Test During Pregnancy Why am I having this test? Routine testing, also called screening, for group B streptococcus (GBS) is recommended for all pregnant women between the 36th and 37th week of pregnancy. GBS is a type of bacteria that can be passed from mother to baby during childbirth. Screening will help guide whether or not you will need treatment during labor and delivery to prevent complications such as:  An infection in your uterus during labor.  An infection in your uterus after delivery.  A serious infection in your baby after delivery, such as pneumonia, meningitis, or sepsis. GBS screening is not often done before 36 weeks of pregnancy unless you go into labor prematurely. What happens if I have group B streptococcus? If testing shows that you have GBS, your health care provider will recommend treatment with IV antibiotics during labor and delivery. This treatment significantly decreases the risk of complications for you and your baby. If you have a planned C-section and you have GBS, you may not need to be treated with antibiotics because GBS is usually passed to babies after labor starts and your water breaks. If you are in labor or your water breaks before your C-section, it is possible for GBS to get into your uterus and be passed to your baby, so you might need treatment. Is there a chance I may not need to be tested? You may not need to be tested for GBS if:  You have a urine test that shows GBS before 36 to 37 weeks.  You had a baby with GBS infection after a previous delivery. In these cases, you will automatically be treated for GBS during labor and delivery. What is being tested? This test is done to check if you have group B streptococcus in your vagina or rectum. What kind of sample is taken? To collect samples for this test, your health care provider will swab your vagina and rectum with a cotton swab. The sample is then sent to the lab to see if  GBS is present. What happens during the test?   You will remove your clothing from the waist down.  You will lie down on an exam table in the same position as you would for a pelvic exam.  Your health care provider will swab your vagina and rectum to collect samples for a culture test.  You will be able to go home after the test and do all your usual activities. How are the results reported? The test results are reported as positive or negative. What do the results mean?  A positive test means you are at risk for passing GBS to your baby during labor and delivery. Your health care provider will recommend that you are treated with an IV antibiotic during labor and delivery.  A negative test means you are at very low risk of passing GBS to your baby. There is still a low risk of passing GBS to your baby because sometimes test results may report that you do not have a condition when you do (false-negative result) or there is a chance that you may become infected with GBS after the test is done. You most likely will not need to be treated with an antibiotic during labor and delivery. Talk with your health care provider about what your results mean. Questions to ask your health care provider Ask your health care provider, or the department that is doing the test:  When will my results be ready?  How will I   get my results?  What are my treatment options? Summary  Routine testing (screening) for group B streptococcus (GBS) is recommended for all pregnant women between the 36th and 37th week of pregnancy.  GBS is a type of bacteria that can be passed from mother to baby during childbirth.  If testing shows that you have GBS, your health care provider will recommend that you are treated with IV antibiotics during labor and delivery. This treatment almost always prevents infection in newborns. This information is not intended to replace advice given to you by your health care provider. Make  sure you discuss any questions you have with your health care provider. Document Revised: 12/08/2018 Document Reviewed: 09/14/2018 Elsevier Patient Education  2020 [employer].   Third Trimester of Pregnancy  The third trimester is from week 28 through week 40 (months 7 through 9). This trimester is when your unborn baby (fetus) is growing very fast. At the end of the ninth month, the unborn baby is about 20 inches in length. It weighs about 6-10 pounds. Follow these instructions at home: Medicines  Take over-the-counter and prescription medicines only as told by your doctor. Some medicines are safe and some medicines are not safe during pregnancy.  Take a prenatal vitamin that contains at least 600 micrograms (mcg) of folic acid.  If you have trouble pooping (constipation), take medicine that will make your stool soft (stool softener) if your doctor approves. Eating and drinking   Eat regular, healthy meals.  Avoid raw meat and uncooked cheese.  If you get low calcium from the food you eat, talk to your doctor about taking a daily calcium supplement.  Eat four or five small meals rather than three large meals a day.  Avoid foods that are high in fat and sugars, such as fried and sweet foods.  To prevent constipation: ? Eat foods that are high in fiber, like fresh fruits and vegetables, whole grains, and beans. ? Drink enough fluids to keep your pee (urine) clear or pale yellow. Activity  Exercise only as told by your doctor. Stop exercising if you start to have cramps.  Avoid heavy lifting, wear low heels, and sit up straight.  Do not exercise if it is too hot, too humid, or if you are in a place of great height (high altitude).  You may continue to have sex unless your doctor tells you not to. Relieving pain and discomfort  Wear a good support bra if your breasts are tender.  Take frequent breaks and rest with your legs raised if you have leg cramps or low back  pain.  Take warm water baths (sitz baths) to soothe pain or discomfort caused by hemorrhoids. Use hemorrhoid cream if your doctor approves.  If you develop puffy, bulging veins (varicose veins) in your legs: ? Wear support hose or compression stockings as told by your doctor. ? Raise (elevate) your feet for 15 minutes, 3-4 times a day. ? Limit salt in your food. Safety  Wear your seat belt when driving.  Make a list of emergency phone numbers, including numbers for family, friends, the hospital, and police and fire departments. Preparing for your baby's arrival To prepare for the arrival of your baby:  Take prenatal classes.  Practice driving to the hospital.  Visit the hospital and tour the maternity area.  Talk to your work about taking leave once the baby comes.  Pack your hospital bag.  Prepare the baby's room.  Go to your doctor visits.  Buy a rear-facing car seat. Learn how to install it in your car. General instructions  Do not use hot tubs, steam rooms, or saunas.  Do not use any products that contain nicotine or tobacco, such as cigarettes and e-cigarettes. If you need help quitting, ask your doctor.  Do not drink alcohol.  Do not douche or use tampons or scented sanitary pads.  Do not cross your legs for long periods of time.  Do not travel for long distances unless you must. Only do so if your doctor says it is okay.  Visit your dentist if you have not gone during your pregnancy. Use a soft toothbrush to brush your teeth. Be gentle when you floss.  Avoid cat litter boxes and soil used by cats. These carry germs that can cause birth defects in the baby and can cause a loss of your baby (miscarriage) or stillbirth.  Keep all your prenatal visits as told by your doctor. This is important. Contact a doctor if:  You are not sure if you are in labor or if your water has broken.  You are dizzy.  You have mild cramps or pressure in your lower belly.  You  have a nagging pain in your belly area.  You continue to feel sick to your stomach, you throw up, or you have watery poop.  You have bad smelling fluid coming from your vagina.  You have pain when you pee. Get help right away if:  You have a fever.  You are leaking fluid from your vagina.  You are spotting or bleeding from your vagina.  You have severe belly cramps or pain.  You lose or gain weight quickly.  You have trouble catching your breath and have chest pain.  You notice sudden or extreme puffiness (swelling) of your face, hands, ankles, feet, or legs.  You have not felt the baby move in over an hour.  You have severe headaches that do not go away with medicine.  You have trouble seeing.  You are leaking, or you are having a gush of fluid, from your vagina before you are 37 weeks.  You have regular belly spasms (contractions) before you are 37 weeks. Summary  The third trimester is from week 28 through week 40 (months 7 through 9). This time is when your unborn baby is growing very fast.  Follow your doctor's advice about medicine, food, and activity.  Get ready for the arrival of your baby by taking prenatal classes, getting all the baby items ready, preparing the baby's room, and visiting your doctor to be checked.  Get help right away if you are bleeding from your vagina, or you have chest pain and trouble catching your breath, or if you have not felt your baby move in over an hour. This information is not intended to replace advice given to you by your health care provider. Make sure you discuss any questions you have with your health care provider. Document Revised: 12/08/2018 Document Reviewed: 09/22/2016 Elsevier Patient Education  [place].

## 2020-02-07 NOTE — Progress Notes (Signed)
   PRENATAL VISIT NOTE  Subjective:  Heidi Orr is a 24 y.o. G2P1001 at [redacted]w[redacted]d being seen today for ongoing prenatal care.  She is currently monitored for the following issues for this low-risk pregnancy and has IBS (irritable bowel syndrome); Abnormal weight loss; DUB (dysfunctional uterine bleeding); Supervision of normal pregnancy, antepartum; and Supervision of high risk pregnancy, antepartum on their problem list.  Patient reports no complaints.  Contractions: Not present. Vag. Bleeding: None.  Movement: Present. Denies leaking of fluid.   The following portions of the patient's history were reviewed and updated as appropriate: allergies, current medications, past family history, past medical history, past social history, past surgical history and problem list.   Objective:   Vitals:   02/07/20 1047  BP: 130/77  Pulse: 73  Weight: 141 lb (64 kg)    Fetal Status: Fetal Heart Rate (bpm): 148 Fundal Height: 36 cm Movement: Present  Presentation: Vertex  General:  Alert, oriented and cooperative. Patient is in no acute distress.  Skin: Skin is warm and dry. No rash noted.   Cardiovascular: Normal heart rate noted  Respiratory: Normal respiratory effort, no problems with respiration noted  Abdomen: Soft, gravid, appropriate for gestational age.  Pain/Pressure: Present     Pelvic: Cervical exam performed in the presence of a chaperone Dilation: 1 Effacement (%): Thick Station: Ballotable  Extremities: Normal range of motion.  Edema: None  Mental Status: Normal mood and affect. Normal behavior. Normal judgment and thought content.   Assessment and Plan:  Pregnancy: G2P1001 at [redacted]w[redacted]d 1. Supervision of high risk pregnancy, antepartum - Patient doing well, no complaints - routine prenatal care - anticipatory guidance on upcoming appointments  - educated and discussed GBS testing during pregnancy, recommendation of antibiotics during labor if positive  - preterm labor precautions  reviewed and reasons to present to [facility]  - Culture, beta strep (group b only) - Cervicovaginal ancillary only( [place])  Preterm labor symptoms and general obstetric precautions including but not limited to vaginal bleeding, contractions, leaking of fluid and fetal movement were reviewed in detail with the patient. Please refer to After Visit Summary for other counseling recommendations.   Return in about 1 week (around 02/14/2020) for ROB-mychart.  Future Appointments  Date Time Provider Department Center  02/16/2020  8:10 AM Kathlene Cote [facility]  02/21/2020 11:30 AM [facility]    Sharyon Cable, CNM

## 2020-02-08 LAB — CERVICOVAGINAL ANCILLARY ONLY
Chlamydia: NEGATIVE
Comment: NEGATIVE
Comment: NEGATIVE
Comment: NORMAL
Neisseria Gonorrhea: NEGATIVE
Trichomonas: NEGATIVE

## 2020-02-11 LAB — CULTURE, BETA STREP (GROUP B ONLY): Strep Gp B Culture: NEGATIVE

## 2020-02-16 ENCOUNTER — Telehealth (INDEPENDENT_AMBULATORY_CARE_PROVIDER_SITE_OTHER): Payer: Medicaid Other | Admitting: Medical

## 2020-02-16 DIAGNOSIS — O0990 Supervision of high risk pregnancy, unspecified, unspecified trimester: Secondary | ICD-10-CM

## 2020-02-16 DIAGNOSIS — Z3A37 37 weeks gestation of pregnancy: Secondary | ICD-10-CM

## 2020-02-16 DIAGNOSIS — O0993 Supervision of high risk pregnancy, unspecified, third trimester: Secondary | ICD-10-CM

## 2020-02-16 NOTE — Progress Notes (Signed)
I connected with Heidi Orr on 02/16/20 at  8:10 AM EDT by: MyChart video and verified that I am speaking with the correct person using two identifiers.  Patient is located at home and provider is located at [facility].     The purpose of this virtual visit is to provide medical care while limiting exposure to the novel coronavirus. I discussed the limitations, risks, security and privacy concerns of performing an evaluation and management service by MyChart video and the availability of in person appointments. I also discussed with the patient that there may be a patient responsible charge related to this service. By engaging in this virtual visit, you consent to the provision of healthcare.  Additionally, you authorize for your insurance to be billed for the services provided during this visit.  The patient expressed understanding and agreed to proceed.  The following staff members participated in the virtual visit:  Corinda Gubler, CMA    PRENATAL VISIT NOTE  Subjective:  Heidi Orr is a 24 y.o. G2P1001 at [redacted]w[redacted]d  for phone visit for ongoing prenatal care.  She is currently monitored for the following issues for this low-risk pregnancy and has IBS (irritable bowel syndrome); DUB (dysfunctional uterine bleeding); and Supervision of high risk pregnancy, antepartum on their problem list.  Patient reports occasional contractions.  Contractions: Irritability. Vag. Bleeding: None.  Movement: Present. Denies leaking of fluid.   The following portions of the patient's history were reviewed and updated as appropriate: allergies, current medications, past family history, past medical history, past social history, past surgical history and problem list.   Objective:   Vitals:   02/16/20 0830  BP: 139/84  Pulse: 73   Self-Obtained  Fetal Status:     Movement: Present     Assessment and Plan:  Pregnancy: G2P1001 at [redacted]w[redacted]d 1. Supervision of high risk pregnancy, antepartum - Doing well - GBS  negative discussed  - Peds list given again and patient encouraged to make a decision this week - Advised abdominal binder may be helpful for increased pressure with ambulation  - Advised elevation of LE may help with swelling, warning signs for worsening condition discussed   Term labor symptoms and general obstetric precautions including but not limited to vaginal bleeding, contractions, leaking of fluid and fetal movement were reviewed in detail with the patient.  Return in about 1 week (around 02/23/2020) for LOB, Virtual.  Future Appointments  Date Time Provider Department Center  02/21/2020 11:30 AM [facility]     Time spent on virtual visit: 10 minutes  Vonzella Nipple, PA-C

## 2020-02-16 NOTE — Patient Instructions (Addendum)
Fetal Movement Counts °Patient Name: ________________________________________________ Patient Due Date: ____________________ °What is a fetal movement count? ° °A fetal movement count is the number of times that you feel your baby move during a certain amount of time. This may also be called a fetal kick count. A fetal movement count is recommended for every pregnant woman. You may be asked to start counting fetal movements as early as week 28 of your pregnancy. °Pay attention to when your baby is most active. You may notice your baby's sleep and wake cycles. You may also notice things that make your baby move more. You should do a fetal movement count: °· When your baby is normally most active. °· At the same time each day. °A good time to count movements is while you are resting, after having something to eat and drink. °How do I count fetal movements? °1. Find a quiet, comfortable area. Sit, or lie down on your side. °2. Write down the date, the start time and stop time, and the number of movements that you felt between those two times. Take this information with you to your health care visits. °3. Write down your start time when you feel the first movement. °4. Count kicks, flutters, swishes, rolls, and jabs. You should feel at least 10 movements. °5. You may stop counting after you have felt 10 movements, or if you have been counting for 2 hours. Write down the stop time. °6. If you do not feel 10 movements in 2 hours, contact your health care provider for further instructions. Your health care provider may want to do additional tests to assess your baby's well-being. °Contact a health care provider if: °· You feel fewer than 10 movements in 2 hours. °· Your baby is not moving like he or she usually does. °Date: ____________ Start time: ____________ Stop time: ____________ Movements: ____________ °Date: ____________ Start time: ____________ Stop time: ____________ Movements: ____________ °Date: ____________  Start time: ____________ Stop time: ____________ Movements: ____________ °Date: ____________ Start time: ____________ Stop time: ____________ Movements: ____________ °Date: ____________ Start time: ____________ Stop time: ____________ Movements: ____________ °Date: ____________ Start time: ____________ Stop time: ____________ Movements: ____________ °Date: ____________ Start time: ____________ Stop time: ____________ Movements: ____________ °Date: ____________ Start time: ____________ Stop time: ____________ Movements: ____________ °Date: ____________ Start time: ____________ Stop time: ____________ Movements: ____________ °This information is not intended to replace advice given to you by your health care provider. Make sure you discuss any questions you have with your health care provider. °Document Revised: 04/06/2019 Document Reviewed: 04/06/2019 °Elsevier Patient Education © 2020 Elsevier Inc. °Braxton Hicks Contractions °Contractions of the uterus can occur throughout pregnancy, but they are not always a sign that you are in labor. You may have practice contractions called Braxton Hicks contractions. These false labor contractions are sometimes confused with true labor. °What are Braxton Hicks contractions? °Braxton Hicks contractions are tightening movements that occur in the muscles of the uterus before labor. Unlike true labor contractions, these contractions do not result in opening (dilation) and thinning of the cervix. Toward the end of pregnancy (32-34 weeks), Braxton Hicks contractions can happen more often and may become stronger. These contractions are sometimes difficult to tell apart from true labor because they can be very uncomfortable. You should not feel embarrassed if you go to the hospital with false labor. °Sometimes, the only way to tell if you are in true labor is for your health care provider to look for changes in the cervix. The health care provider   will do a physical exam and may  monitor your contractions. If you are not in true labor, the exam should show that your cervix is not dilating and your water has not broken. °If there are no other health problems associated with your pregnancy, it is completely safe for you to be sent home with false labor. You may continue to have Braxton Hicks contractions until you go into true labor. °How to tell the difference between true labor and false labor °True labor °· Contractions last 30-70 seconds. °· Contractions become very regular. °· Discomfort is usually felt in the top of the uterus, and it spreads to the lower abdomen and low back. °· Contractions do not go away with walking. °· Contractions usually become more intense and increase in frequency. °· The cervix dilates and gets thinner. °False labor °· Contractions are usually shorter and not as strong as true labor contractions. °· Contractions are usually irregular. °· Contractions are often felt in the front of the lower abdomen and in the groin. °· Contractions may go away when you walk around or change positions while lying down. °· Contractions get weaker and are shorter-lasting as time goes on. °· The cervix usually does not dilate or become thin. °Follow these instructions at home: ° °· Take over-the-counter and prescription medicines only as told by your health care provider. °· Keep up with your usual exercises and follow other instructions from your health care provider. °· Eat and drink lightly if you think you are going into labor. °· If Braxton Hicks contractions are making you uncomfortable: °? Change your position from lying down or resting to walking, or change from walking to resting. °? Sit and rest in a tub of warm water. °? Drink enough fluid to keep your urine pale yellow. Dehydration may cause these contractions. °? Do slow and deep breathing several times an hour. °· Keep all follow-up prenatal visits as told by your health care provider. This is important. °Contact a  health care provider if: °· You have a fever. °· You have continuous pain in your abdomen. °Get help right away if: °· Your contractions become stronger, more regular, and closer together. °· You have fluid leaking or gushing from your vagina. °· You pass blood-tinged mucus (bloody show). °· You have bleeding from your vagina. °· You have low back pain that you never had before. °· You feel your baby’s head pushing down and causing pelvic pressure. °· Your baby is not moving inside you as much as it used to. °Summary °· Contractions that occur before labor are called Braxton Hicks contractions, false labor, or practice contractions. °· Braxton Hicks contractions are usually shorter, weaker, farther apart, and less regular than true labor contractions. True labor contractions usually become progressively stronger and regular, and they become more frequent. °· Manage discomfort from Braxton Hicks contractions by changing position, resting in a warm bath, drinking plenty of water, or practicing deep breathing. °This information is not intended to replace advice given to you by your health care provider. Make sure you discuss any questions you have with your health care provider. °Document Revised: 07/30/2017 Document Reviewed: 12/31/2016 °Elsevier Patient Education © 2020 Elsevier Inc. ° °AREA PEDIATRIC/FAMILY PRACTICE PHYSICIANS ° °[place] ([address]) °• [facility] °o Chambliss, MD; Eniola, MD; Hale, MD; Hensel, MD; McDiarmid, MD; McIntyer, MD; Neal, MD; Walden, MD °o [address] [place], [place] [address] °o [phone] °o Mon-Fri 8:30-12:30, 1:30-5:00 °o Providers come to see babies   at [facility] °o Accepting Medicaid °• [facility] °o Limited providers who accept newborns: Koirala, MD; Morrow, MD; Wolters, MD °o [address], [place], [place] [address] °o [phone] °o Mon-Fri 8:00-5:30 °o Babies seen by providers at  [facility] °o Does NOT accept Medicaid °o Please call early in hospitalization for appointment (limited availability)  °• [facility] °o Mulberry, MD °o [address] [place], [place] [address] °o [phone] °o Mon, Tue, Thur, Fri 8:30-5:00, Wed 10:00-7:00 (closed 1-2pm) °o Babies seen by [facility] providers °o Accepting Medicaid °• [facility] °o Rubin, MD °o [address], [place], [place] [address] °o [phone] °o Mon-Fri 8:30-5:00, Sat 8:30-12:00 °o Provider comes to see babies at [facility] °o Accepting Medicaid °o Must have been referred from current patients or contacted office prior to delivery °• [facility] ([facility]) °o Brown, MD; Chandler, MD; Ettefagh, MD; Grant, MD; Lester, MD; McCormick, MD; McQueen, MD; Prose, MD; Simha, MD; Stanley, MD; Stryffeler, NP; Tebben, NP °o [address], [place], [place] [address] °o [phone] °o Mon, Tue, Thur, Fri 8:30-5:30, Wed 9:30-5:30, Sat 8:30-12:30 °o Babies seen by [facility] providers °o Accepting Medicaid °o Only accepting infants of first-time parents or siblings of current patients °o Hospital discharge coordinator will make follow-up appointment °• Jack Amos °o [address], [place], [place]  [address] °o [phone]   Fax - [phone] °• [facility] °o [address], [place], [place]  [address] °o Phone - [phone]   Fax - [phone] °• Shilpa Gosrani °o [address], [place], [place]  [address] °o [phone] ° °[place] ([address]) °• [facility] °o Bates, MD; Brassfield, MD; Cooper, Cox, MD; MD; Davis, MD; Dovico, MD; Ettefaugh, MD; Little, MD; Lowe, MD; Keiffer, MD; Melvin, MD; Sumner, MD; Williams, MD °o [address], [place], [place] [address] °o [phone] °o Mon-Fri 8:30-5:00 (extended evenings Mon-Thur as needed), Sat-Sun  10:00-1:00 °o Providers come to see babies at [facility] °o Accepting Medicaid for families of first-time babies and families with all children in the household age 3 and under. Must register with office prior to making appointment (M-F only). °• [facility] °o Henson, NP; Knapp, MD; Lalonde, MD; Tysinger, PA °o [address] [place], [place] [address] °o [phone] °o Mon-Fri 8:00-5:00 °o Babies seen by providers at [facility] °o Does NOT accept Medicaid/Commercial Insurance Only °• [facility] ([facility]  °o Artis, MD; Barnes, MD; Bratton, MD; Coccaro, MD; Lockett Gardner, MD; Kramer, MD; Marshall, MD; Netherton, MD; Poleto, MD; Skinner, MD °o [address] [place], [place] [address] °o [phone] °o Mon-Fri 8:30-5:30, Sat (Oct.-Mar.) 9:00-1:00 °o Babies seen by providers at [facility] °o Accepting Medicaid ° °[place] ([address]) °• [facility] °o Reid, MD; Warner, MD °o [address], [place], [place] [address] °o [phone] °o Mon-Fri 8:30-5:00, Sat 8:30-12:00 °o Providers come to see babies at [facility] °o Does NOT accept Medicaid °• [facility] °o Becker, PA; Hagler, MD; Scifres, PA; Sun, MD; Swayne, MD °o [address], [place],  [address] °o [phone] °o Mon-Fri 8:00-5:00 °o Babies seen by providers at [facility] °o Does NOT accept Medicaid °o Only accepting babies of parents who are patients °o Please call   early in hospitalization for appointment (limited availability) °• [facility] °o Clark, MD; Frye, MD; Kelleher, MD; Mack, NP; Miller, MD; O'Keller, MD; Patterson, NP; Pudlo, MD; Puzio, MD; Thomas, MD; Tucker, MD; Twiselton, MD °o [address], [place], [place] [address] °o [phone] °o Mon-Fri 8:00-5:00, Sat 9:00-12:00 °o Providers come to see babies at [facility] °o Does NOT accept  Medicaid ° °[place] ([address]) °• [facility] °o Limited providers accepting new patients: Brake, NP; Wharton, PA °o [address], [place], [place] [address] °o [phone] °o Mon-Fri 8:00-5:00 °o Babies seen by providers at [facility] °o Does NOT accept Medicaid °o Only accepting babies of parents who are patients °o Please call early in hospitalization for appointment (limited availability) °• [facility] °o Gay, MD; Quinlan, MD °o [address] [place], [place] [address] °o [phone] (press 1 to schedule appointment) °o Mon-Fri 8:00-5:00 °o Providers come to see babies at [facility] °o Does NOT accept Medicaid °• [facility] °o Mazer, MD °o [address] [place], [place] [address] °o [phone] °o Mon-Fri 8:30-5:00 (lunch 12:30-1:00), extended hours by appointment only Wed 5:00-6:30 °o Babies seen by [facility] providers °o Accepting Medicaid °• [facility] °o Banks, MD; Jordan, MD; Koberlein, MD °o [address], [place], [place] [address] °o [phone] °o Mon-Fri 8:00-5:00 °o Babies seen by [facility] providers °o Does NOT accept Medicaid °• [facility] °o Parker, MD; Hunter, MD; Wallace, DO °o [address] [place], [place] [address] °o [phone] °o Mon-Fri 8:00-5:00 °o Babies seen by [facility] providers °o Does NOT accept Medicaid °• [facility] °o Brandon, PA; Brecken, PA; Christy, NP; Dees, MD; DeClaire, MD; DeWeese, MD; Hansen, NP; Mills, NP; Parrish, NP; Smoot, NP; Summer, MD; Vapne, MD °o [address] [place], [place] [address] °o [phone] °o Mon-Fri 8:30-5:00, Sat 10:00-1:00 °o Providers come to see babies at [facility] °o Does NOT accept Medicaid °o Free prenatal information session Tuesdays at 4:45pm °• [facility] °o Bouska, MD; Gordon, PA; Jeffery, PA; Weber, PA °o [address] [place] [address] °o [phone] °o Mon-Fri 7:30-5:30 °o Babies seen by [facility] providers °• [facility] °o [address], [place], [place]  [address] °o [phone]   Fax - [phone] ° °[place] ([address]) °• [facility] °o Reese, MD °o [address] [place], [place] [address] °o [phone] °o Mon-Thur 8:00-6:00 °o Providers come to see babies at [facility] °o Accepting Medicaid °• [facility] °o Anderson, NP; Badger, MD; Beal, PA; Spencer, PA °o [address] [place], [place] [address] °o [phone] °o Mon-Thur 7:30-7:30, Fri 7:30-4:30 °o Babies seen by [facility] providers °o Accepting Medicaid °• [facility] °o Agbuya, MD; Klett, NP; Romgoolam, MD °o [address], [place], [place] [address] °o [phone] °o Mon-Fri 8:30-5:00, Sat 8:30-12:00 °o Providers come to see babies at [facility] °o Accepting Medicaid °o Must have “Meet & Greet” appointment at office prior to delivery °• [facility] ([facility]) °o McCord, MD; Wallace, MD; Wood, MD °o [address], [place],  [address] °o [phone] °o Mon-Wed 8:00-6:00, Thur-Fri 8:00-5:00, Sat 9:00-12:00 °o Providers come to see babies at [facility] °o Does NOT accept Medicaid °o Only accepting siblings of current patients °• [facility]   °  o [address], [place], [place]  [address] °o [phone]   Fax - [phone] °• [facility] °o [address], [place], [place]  [address] °o [phone]   Fax - [phone] ° °[place] ([address]) °• [facility] °o Cirigliano, DO; Matthews, DO °o [address] [place], [place] [address] °o [phone] °o Mon-Fri 7:00-5:00 °o Babies seen by [facility] providers °o Does NOT  accept Medicaid °• [facility] °o Briscoe, MD; Howley, PA; Moreira, PA °o [address], [place], [place] [address] °o [phone] °o Mon-Fri 8:00-5:00 °o Babies seen by [facility] providers °o Accepting Medicaid °• [facility] °o Boyd, MD; Church, PA; Jones, NP; Osborn, PA °o [address], [place], [place] [address] °o [phone] °o Mon-Fri 8:00-5:00 °o Babies seen by providers at [facility] °o Accepting Medicaid ° °[place] ([address]) °• [facility] [place] °o Wendling, DO °o [address] [place], [place] [address] °o [phone] °o Mon-Fri 8:00-5:00 °o Babies seen by [facility] providers °o Does NOT accept Medicaid °o Limited availability, please call early in hospitalization to schedule follow-up °• [facility] °o Calderon, PA; Cummings, MD; Dillard, MD; Martin, PA; Olson, MD; VanDeven, PA °o [address], [place], [place] [address] °o [phone] °o Mon-Fri 8:30-5:00, Sat 9:00-12:00 °o Babies seen by providers at [facility] °o Accepting Medicaid °o Please register online then schedule online or call office °o [email or web address] °• [facility] ([facility] °o Hunter, NP; Kumar, MD; Martin Rogers, PA °o [address], [place], [place] [address] °o [phone] °o Mon-Fri 8:00-5:00 °o Babies seen by providers at [facility] °o Accepting Medicaid °• [facility] ([facility] °o Biron, MD; Kristi Fleenor, NP; West, MD °o [address], [place], [place] [address] °o [phone] °o Mon-Fri 8:00-5:30, Sat&Sun by appointment (phones open at 8:30) °o Babies seen by [facility] providers °o Accepting Medicaid °o Must be a first-time baby or sibling of current patient °• [facility] [place]  °o [address], [place], [place]  [address] °o [phone]   Fax - [phone] ° °[place] ([address]) °• [facility] °o Brown, PA; Cowen, PA; Rice, MD; Helton, PA; Spry, MD °o [address] [place], [place] [address] °o [phone] °o Mon-Thur 8:00-7:00, Fri 8:00-5:00, Sat 8:00-12:00, Sun 9:00-12:00 °o Babies seen by [facility] providers °o Accepting Medicaid °• [facility] °o Coe-Goins, MD; Marshall, MD; Pierre-Louis, MD °o [address], [place], [place] [address] °o [phone] °o Mon-Thur 8:00-5:00 °o Babies seen by providers at [facility] °o Accepting Medicaid °• [facility] °o Bratton, MD; Coe-Goins, MD; Hayes, MD; Lewis, MD; List, MD; Lott, MD; Marshall, MD; Moran, MD; O'Neal, MD; Pierre-Louis, MD; Pitonzo, MD; Scholer, MD; Spangle, MD °o [address] [place],  [address] °o [phone] °o Mon-Fri 8:00-5:30, Sat (Oct.-Mar.) 9:00-1:00 °o Babies seen by providers at [facility] °o Accepting Medicaid °o Must fill out new patient packet, available online at [email or web address] °• [facility] ([facility]) °o Friddle, NP; Harris, NP; Kelly, NP; Logan,   MD; Melvin, PA; Poth, MD; Ramadoss, MD; Stanton, NP °o [address], [place], [place] [address] °o [phone] °o Mon-Thur 8:00-5:30, Fri 8:00-5:00 °o Babies seen by providers at [facility] °o Accepting Medicaid ° °[place] ([address]) °• [facility] °o Dixon, PA; Cocoa, MD; Pickard, MD; Tapia, PA °o [address], [place], [place] [address] °o [phone] °o Mon-Fri 8:00-5:00 °o Babies seen by providers at [facility] °o Accepting Medicaid  ° °[place] ([address]) °• [facility] [place] °o Masneri, DO; Meyers, MD; Nelson, PA °o [address], [place], [place]  [address] °o [phone] °o Mon-Fri 8:00-5:00 °o Babies seen by providers at [facility] °o Does NOT accept Medicaid °o Limited appointment availability, please call early in hospitalization ° °• [facility] [place] °o Kunedd, DO; McGowen, MD °o [address], [place], [place] [address] °o [phone] °o Mon-Fri 8:00-5:00 °o Babies seen by [facility] providers °o Does NOT accept Medicaid °• [facility] [place] °o Cameron, MD; MacDonald, MD; Michaels, PA; Nayak, MD °o [address] [place] [address], [place], [place] [address] °o [phone] °o Mon-Fri 8:00-5:00 °o After hours clinic ([address] [place], [place] [address]) [phone] Mon-Fri 5:00-8:00, Sat 12:00-6:00, Sun 10:00-4:00 °o Babies seen by [facility] providers °o Accepting Medicaid °• [facility] [place] °o [address], [place], [place]  [address] °o [phone]   Fax - [phone] ° °[place] ([address]) °• [facility] [place] °o Andy, MD °o [address], [place], [place] [address] °o [phone] °o Mon-Fri 8:00-5:00 °o Babies seen by [facility] providers °o Does NOT accept Medicaid °• [facility] [place] ([facility] [place]) °o Eksir, MD °o [address], [place], [place] [address] °o [phone] °o Mon-Thur 8:00-7:00, Fri 8:00-5:00, Sat 8:00-12:00 °o Babies seen by providers at [facility] °o Accepting Medicaid - but does not have vaccinations in office (must be received elsewhere) °o Limited availability, please call early in hospitalization ° °[place] ([address]) °• [facility]  °o Charlene Flemming, MD °o [address], [place]  [address] °o [phone]  Fax [phone] ° ° °

## 2020-02-21 ENCOUNTER — Other Ambulatory Visit: Payer: Self-pay

## 2020-02-21 ENCOUNTER — Ambulatory Visit: Payer: Medicaid Other | Attending: Obstetrics and Gynecology

## 2020-02-21 DIAGNOSIS — O0933 Supervision of pregnancy with insufficient antenatal care, third trimester: Secondary | ICD-10-CM

## 2020-02-21 DIAGNOSIS — Z3493 Encounter for supervision of normal pregnancy, unspecified, third trimester: Secondary | ICD-10-CM | POA: Diagnosis present

## 2020-02-21 DIAGNOSIS — Z3A37 37 weeks gestation of pregnancy: Secondary | ICD-10-CM | POA: Diagnosis not present

## 2020-02-21 DIAGNOSIS — Z362 Encounter for other antenatal screening follow-up: Secondary | ICD-10-CM

## 2020-02-23 ENCOUNTER — Telehealth (INDEPENDENT_AMBULATORY_CARE_PROVIDER_SITE_OTHER): Payer: Medicaid Other | Admitting: Medical

## 2020-02-23 ENCOUNTER — Encounter: Payer: Self-pay | Admitting: Medical

## 2020-02-23 VITALS — BP 132/76 | HR 78

## 2020-02-23 DIAGNOSIS — O0990 Supervision of high risk pregnancy, unspecified, unspecified trimester: Secondary | ICD-10-CM

## 2020-02-23 DIAGNOSIS — R87610 Atypical squamous cells of undetermined significance on cytologic smear of cervix (ASC-US): Secondary | ICD-10-CM | POA: Insufficient documentation

## 2020-02-23 DIAGNOSIS — Z3A38 38 weeks gestation of pregnancy: Secondary | ICD-10-CM

## 2020-02-23 DIAGNOSIS — O0993 Supervision of high risk pregnancy, unspecified, third trimester: Secondary | ICD-10-CM

## 2020-02-23 NOTE — Progress Notes (Signed)
I connected with Heidi Orr on 02/23/20 at  8:40 AM EDT by: MyChart video and verified that I am speaking with the correct person using two identifiers.  Patient is located at home and provider is located at [facility].     The purpose of this virtual visit is to provide medical care while limiting exposure to the novel coronavirus. I discussed the limitations, risks, security and privacy concerns of performing an evaluation and management service by MyChart video and the availability of in person appointments. I also discussed with the patient that there may be a patient responsible charge related to this service. By engaging in this virtual visit, you consent to the provision of healthcare.  Additionally, you authorize for your insurance to be billed for the services provided during this visit.  The patient expressed understanding and agreed to proceed.  The following staff members participated in the virtual visit:  Heidi Orr, CMA    PRENATAL VISIT NOTE  Subjective:  Heidi Orr is a 24 y.o. G2P1001 at [redacted]w[redacted]d  for phone visit for ongoing prenatal care.  She is currently monitored for the following issues for this low-risk pregnancy and has IBS (irritable bowel syndrome); DUB (dysfunctional uterine bleeding); Supervision of high risk pregnancy, antepartum; and ASCUS of cervix with negative high risk HPV on their problem list.  Patient reports no complaints.  Contractions: Not present. Vag. Bleeding: None.  Movement: Present. Denies leaking of fluid.   The following portions of the patient's history were reviewed and updated as appropriate: allergies, current medications, past family history, past medical history, past social history, past surgical history and problem list.   Objective:   Vitals:   02/23/20 0854  BP: 132/76  Pulse: 78   Self-Obtained  Fetal Status:     Movement: Present     Assessment and Plan:  Pregnancy: G2P1001 at [redacted]w[redacted]d 1. Supervision of high risk pregnancy,  antepartum - Doing well - Encouraged choosing Peds  - Plan to schedule IOL at [place]  2. ASCUS of cervix with negative high risk HPV - [facility]P recommends follow-up in 3 years  Term labor symptoms and general obstetric precautions including but not limited to vaginal bleeding, contractions, leaking of fluid and fetal movement were reviewed in detail with the patient.  Return in about 1 week (around 03/01/2020) for LOB, In-Person.  Future Appointments  Date Time Provider Department Center  02/29/2020  1:35 PM Adam Phenix, MD [facility]  02/29/2020  3:15 PM [facility]     Time spent on virtual visit: 10 minutes  Vonzella Nipple, PA-C

## 2020-02-23 NOTE — Patient Instructions (Signed)
Fetal Movement Counts Patient Name: ________________________________________________ Patient Due Date: ____________________ What is a fetal movement count?  A fetal movement count is the number of times that you feel your baby move during a certain amount of time. This may also be called a fetal kick count. A fetal movement count is recommended for every pregnant woman. You may be asked to start counting fetal movements as early as week 28 of your pregnancy. Pay attention to when your baby is most active. You may notice your baby's sleep and wake cycles. You may also notice things that make your baby move more. You should do a fetal movement count:  When your baby is normally most active.  At the same time each day. A good time to count movements is while you are resting, after having something to eat and drink. How do I count fetal movements? 1. Find a quiet, comfortable area. Sit, or lie down on your side. 2. Write down the date, the start time and stop time, and the number of movements that you felt between those two times. Take this information with you to your health care visits. 3. Write down your start time when you feel the first movement. 4. Count kicks, flutters, swishes, rolls, and jabs. You should feel at least 10 movements. 5. You may stop counting after you have felt 10 movements, or if you have been counting for 2 hours. Write down the stop time. 6. If you do not feel 10 movements in 2 hours, contact your health care provider for further instructions. Your health care provider may want to do additional tests to assess your baby's well-being. Contact a health care provider if:  You feel fewer than 10 movements in 2 hours.  Your baby is not moving like he or she usually does. Date: ____________ Start time: ____________ Stop time: ____________ Movements: ____________ Date: ____________ Start time: ____________ Stop time: ____________ Movements: ____________ Date: ____________  Start time: ____________ Stop time: ____________ Movements: ____________ Date: ____________ Start time: ____________ Stop time: ____________ Movements: ____________ Date: ____________ Start time: ____________ Stop time: ____________ Movements: ____________ Date: ____________ Start time: ____________ Stop time: ____________ Movements: ____________ Date: ____________ Start time: ____________ Stop time: ____________ Movements: ____________ Date: ____________ Start time: ____________ Stop time: ____________ Movements: ____________ Date: ____________ Start time: ____________ Stop time: ____________ Movements: ____________ This information is not intended to replace advice given to you by your health care provider. Make sure you discuss any questions you have with your health care provider. Document Revised: 04/06/2019 Document Reviewed: 04/06/2019 Elsevier Patient Education  2020 Else[employer] Inc[employer] Braxton Hicks Contractions Contractions of the uterus can occur throughout pregnancy, but they are not always a sign that you are in labor. You may have practice contractions called Braxton Hicks contractions. These false labor contractions are sometimes confused with true labor. What are Braxton Hicks contractions? Braxton Hicks contractions are tightening movements that occur in the muscles of the uterus before labor. Unlike true labor contractions, these contractions do not result in opening (dilation) and thinning of the cervix. Toward the end of pregnancy (32-34 weeks), Braxton Hicks contractions can happen more often and may become stronger. These contractions are sometimes difficult to tell apart from true labor because they can be very uncomfortable. You should not feel embarrassed if you go to the hospital with false labor. Sometimes, the only way to tell if you are in true labor is for your health care provider to look for changes in the cervix. The health care provider   will do a physical exam and may  monitor your contractions. If you are not in true labor, the exam should show that your cervix is not dilating and your water has not broken. If there are no other health problems associated with your pregnancy, it is completely safe for you to be sent home with false labor. You may continue to have Braxton Hicks contractions until you go into true labor. How to tell the difference between true labor and false labor True labor  Contractions last 30-70 seconds.  Contractions become very regular.  Discomfort is usually felt in the top of the uterus, and it spreads to the lower abdomen and low back.  Contractions do not go away with walking.  Contractions usually become more intense and increase in frequency.  The cervix dilates and gets thinner. False labor  Contractions are usually shorter and not as strong as true labor contractions.  Contractions are usually irregular.  Contractions are often felt in the front of the lower abdomen and in the groin.  Contractions may go away when you walk around or change positions while lying down.  Contractions get weaker and are shorter-lasting as time goes on.  The cervix usually does not dilate or become thin. Follow these instructions at home:   Take over-the-counter and prescription medicines only as told by your health care provider.  Keep up with your usual exercises and follow other instructions from your health care provider.  Eat and drink lightly if you think you are going into labor.  If Braxton Hicks contractions are making you uncomfortable: ? Change your position from lying down or resting to walking, or change from walking to resting. ? Sit and rest in a tub of warm water. ? Drink enough fluid to keep your urine pale yellow. Dehydration may cause these contractions. ? Do slow and deep breathing several times an hour.  Keep all follow-up prenatal visits as told by your health care provider. This is important. Contact a  health care provider if:  You have a fever.  You have continuous pain in your abdomen. Get help right away if:  Your contractions become stronger, more regular, and closer together.  You have fluid leaking or gushing from your vagina.  You pass blood-tinged mucus (bloody show).  You have bleeding from your vagina.  You have low back pain that you never had before.  You feel your baby's head pushing down and causing pelvic pressure.  Your baby is not moving inside you as much as it used to. Summary  Contractions that occur before labor are called Braxton Hicks contractions, false labor, or practice contractions.  Braxton Hicks contractions are usually shorter, weaker, farther apart, and less regular than true labor contractions. True labor contractions usually become progressively stronger and regular, and they become more frequent.  Manage discomfort from Braxton Hicks contractions by changing position, resting in a warm bath, drinking plenty of water, or practicing deep breathing. This information is not intended to replace advice given to you by your health care provider. Make sure you discuss any questions you have with your health care provider. Document Revised: 07/30/2017 Document Reviewed: 12/31/2016 Elsevier Patient Education  2020 Elsevier Inc.  

## 2020-02-26 ENCOUNTER — Encounter (HOSPITAL_COMMUNITY): Payer: Self-pay | Admitting: Family Medicine

## 2020-02-26 ENCOUNTER — Other Ambulatory Visit: Payer: Self-pay

## 2020-02-26 ENCOUNTER — Inpatient Hospital Stay (HOSPITAL_COMMUNITY)
Admission: AD | Admit: 2020-02-26 | Discharge: 2020-02-27 | DRG: 807 | Disposition: A | Discharge status: Home / Self Care | Payer: Medicaid Other | Attending: Family Medicine | Admitting: Family Medicine

## 2020-02-26 DIAGNOSIS — Z3A38 38 weeks gestation of pregnancy: Secondary | ICD-10-CM

## 2020-02-26 DIAGNOSIS — O0990 Supervision of high risk pregnancy, unspecified, unspecified trimester: Secondary | ICD-10-CM

## 2020-02-26 DIAGNOSIS — Z20822 Contact with and (suspected) exposure to covid-19: Secondary | ICD-10-CM | POA: Diagnosis present

## 2020-02-26 DIAGNOSIS — R87610 Atypical squamous cells of undetermined significance on cytologic smear of cervix (ASC-US): Secondary | ICD-10-CM | POA: Diagnosis present

## 2020-02-26 DIAGNOSIS — O26893 Other specified pregnancy related conditions, third trimester: Secondary | ICD-10-CM | POA: Diagnosis present

## 2020-02-26 LAB — CBC
HCT: 33.9 % — ABNORMAL LOW (ref 36.0–46.0)
Hemoglobin: 10.8 g/dL — ABNORMAL LOW (ref 12.0–15.0)
MCH: 26.9 pg (ref 26.0–34.0)
MCHC: 31.9 g/dL (ref 30.0–36.0)
MCV: 84.3 fL (ref 80.0–100.0)
Platelets: 268 10*3/uL (ref 150–400)
RBC: 4.02 MIL/uL (ref 3.87–5.11)
RDW: 14.2 % (ref 11.5–15.5)
WBC: 10.7 10*3/uL — ABNORMAL HIGH (ref 4.0–10.5)
nRBC: 0 % (ref 0.0–0.2)

## 2020-02-26 LAB — ABO/RH: ABO/RH(D): A POS

## 2020-02-26 LAB — TYPE AND SCREEN
ABO/RH(D): A POS
Antibody Screen: NEGATIVE

## 2020-02-26 LAB — SARS CORONAVIRUS 2 BY RT PCR (HOSPITAL ORDER, PERFORMED IN ~~LOC~~ HOSPITAL LAB): SARS Coronavirus 2: NEGATIVE

## 2020-02-26 MED ORDER — ACETAMINOPHEN 325 MG PO TABS: 650.0000 mg | ORAL_TABLET | ORAL | Status: DC | PRN

## 2020-02-26 MED ORDER — PHENYLEPHRINE 40 MCG/ML (10ML) SYRINGE FOR IV PUSH (FOR BLOOD PRESSURE SUPPORT): 80.0000 ug | PREFILLED_SYRINGE | INTRAVENOUS | Status: DC | PRN

## 2020-02-26 MED ORDER — DIBUCAINE (PERIANAL) 1 % EX OINT: 1.0000 "application " | TOPICAL_OINTMENT | CUTANEOUS | Status: DC | PRN

## 2020-02-26 MED ORDER — EPHEDRINE 5 MG/ML INJ: 10.0000 mg | INTRAVENOUS | Status: DC | PRN

## 2020-02-26 MED ORDER — MEDROXYPROGESTERONE ACETATE 150 MG/ML IM SUSP: 150.0000 mg | Freq: Once | INTRAMUSCULAR | Status: DC

## 2020-02-26 MED ORDER — WITCH HAZEL-GLYCERIN EX PADS: 1.0000 "application " | MEDICATED_PAD | CUTANEOUS | Status: DC | PRN

## 2020-02-26 MED ORDER — SIMETHICONE 80 MG PO CHEW: 80.0000 mg | CHEWABLE_TABLET | ORAL | Status: DC | PRN

## 2020-02-26 MED ORDER — ONDANSETRON HCL 4 MG PO TABS: 4.0000 mg | ORAL_TABLET | ORAL | Status: DC | PRN

## 2020-02-26 MED ORDER — COCONUT OIL OIL: 1.0000 "application " | TOPICAL_OIL | Status: DC | PRN

## 2020-02-26 MED ORDER — DIPHENHYDRAMINE HCL 50 MG/ML IJ SOLN: 12.5000 mg | INTRAMUSCULAR | Status: DC | PRN

## 2020-02-26 MED ORDER — OXYTOCIN BOLUS FROM INFUSION
333.0000 mL | Freq: Once | INTRAVENOUS | Status: AC
  Administered 2020-02-26: 333 mL via INTRAVENOUS

## 2020-02-26 MED ORDER — LIDOCAINE HCL (PF) 1 % IJ SOLN: 30.0000 mL | INTRAMUSCULAR | Status: DC | PRN

## 2020-02-26 MED ORDER — IBUPROFEN 600 MG PO TABS
600.0000 mg | ORAL_TABLET | Freq: Four times a day (QID) | ORAL | Status: DC
  Filled 2020-02-26 (×3): qty 1

## 2020-02-26 MED ORDER — FENTANYL-BUPIVACAINE-NACL 0.5-0.125-0.9 MG/250ML-% EP SOLN
EPIDURAL | Status: AC
  Filled 2020-02-26: qty 250

## 2020-02-26 MED ORDER — OXYCODONE HCL 5 MG PO TABS: 5.0000 mg | ORAL_TABLET | ORAL | Status: DC | PRN

## 2020-02-26 MED ORDER — ONDANSETRON HCL 4 MG/2ML IJ SOLN: 4.0000 mg | INTRAMUSCULAR | Status: DC | PRN

## 2020-02-26 MED ORDER — FENTANYL CITRATE (PF) 100 MCG/2ML IJ SOLN
50.0000 ug | INTRAMUSCULAR | Status: DC | PRN
  Administered 2020-02-26 (×2): 100 ug via INTRAVENOUS
  Filled 2020-02-26 (×2): qty 2

## 2020-02-26 MED ORDER — LACTATED RINGERS IV SOLN: INTRAVENOUS | Status: DC

## 2020-02-26 MED ORDER — MAGNESIUM HYDROXIDE 400 MG/5ML PO SUSP: 30.0000 mL | ORAL | Status: DC | PRN

## 2020-02-26 MED ORDER — LACTATED RINGERS IV SOLN: 500.0000 mL | Freq: Once | INTRAVENOUS | Status: DC

## 2020-02-26 MED ORDER — LACTATED RINGERS IV SOLN: 500.0000 mL | INTRAVENOUS | Status: DC | PRN

## 2020-02-26 MED ORDER — BENZOCAINE-MENTHOL 20-0.5 % EX AERO: 1.0000 "application " | INHALATION_SPRAY | CUTANEOUS | Status: DC | PRN

## 2020-02-26 MED ORDER — SOD CITRATE-CITRIC ACID 500-334 MG/5ML PO SOLN: 30.0000 mL | ORAL | Status: DC | PRN

## 2020-02-26 MED ORDER — DIPHENHYDRAMINE HCL 25 MG PO CAPS: 25.0000 mg | ORAL_CAPSULE | Freq: Four times a day (QID) | ORAL | Status: DC | PRN

## 2020-02-26 MED ORDER — ONDANSETRON HCL 4 MG/2ML IJ SOLN: 4.0000 mg | Freq: Four times a day (QID) | INTRAMUSCULAR | Status: DC | PRN

## 2020-02-26 MED ORDER — PRENATAL MULTIVITAMIN CH
1.0000 | ORAL_TABLET | Freq: Every day | ORAL | Status: DC
  Administered 2020-02-27: 1 via ORAL
  Filled 2020-02-26: qty 1

## 2020-02-26 MED ORDER — FENTANYL-BUPIVACAINE-NACL 0.5-0.125-0.9 MG/250ML-% EP SOLN: 12.0000 mL/h | EPIDURAL | Status: DC | PRN

## 2020-02-26 MED ORDER — OXYTOCIN-SODIUM CHLORIDE 30-0.9 UT/500ML-% IV SOLN
2.5000 [IU]/h | INTRAVENOUS | Status: DC
  Filled 2020-02-26: qty 500

## 2020-02-26 MED ORDER — OXYCODONE HCL 5 MG PO TABS: 10.0000 mg | ORAL_TABLET | ORAL | Status: DC | PRN

## 2020-02-26 MED ORDER — SENNOSIDES-DOCUSATE SODIUM 8.6-50 MG PO TABS
2.0000 | ORAL_TABLET | ORAL | Status: DC
  Administered 2020-02-26: 2 via ORAL
  Filled 2020-02-26: qty 2

## 2020-02-26 NOTE — Lactation Note (Signed)
This note was copied from a baby's chart. Lactation Consultation Note  Patient Name: Heidi Orr [id number] Date: 02/26/2020 Reason for consult: Initial assessment;Early term 31-38.6wks  P2 mother whose infant is now 43 hours old.  This is an ETI at 38+4 weeks.  Mother breast fed her first child (now 26 years old) for three months.  Baby was asleep in the bassinet when I arrived.  Mother was happy to report that he has breast fed three times since delivery.  Mother feels like he latched well as soon as he was born and continues to latch well.  She denies pain with latching.  This was a fast delivery and baby has had a couple of episodes of emesis which the parents feel comfortable with.  Bulb syringe in crib and parents are familiar with use.  Encouraged to feed 8-12 times/24 hours or sooner if baby shows feeding cues.  Reviewed cues with parents.  Mother is familiar with hand expression and has been able to express colostrum.  Container provided and milk storage times reviewed.  Finger feeding demonstrated.  Mother will call for latch assistance as needed.  Mom made aware of O/P services, breastfeeding support groups, community resources, and our phone # for post-discharge questions.  Mother has a DEBP for home use.  Family would like to be discharged home tomorrow if possible.      Maternal Data Formula Feeding for Exclusion: Yes Reason for exclusion: Mother's choice to formula and breast feed on admission Has patient been taught Hand Expression?: Yes Does the patient have breastfeeding experience prior to this delivery?: Yes  Feeding Feeding Type: Breast Fed  LATCH Score                   Interventions    Lactation Tools Discussed/Used     Consult Status Consult Status: Follow-up Date: 02/27/20 Follow-up type: In-patient    Dora Sims 02/26/2020, 5:33 PM

## 2020-02-26 NOTE — MAU Note (Signed)
Contractions started around 7, now < q52min.  Some pinkish bleeding. No LOF. +FM.  No problems with preg.

## 2020-02-26 NOTE — H&P (Signed)
LABOR AND DELIVERY ADMISSION HISTORY AND PHYSICAL NOTE  Heidi Orr is a 24 y.o. female G2P1001 with IUP at [redacted]w[redacted]d by LMP c/w 33wk [place] presenting for active labor.   Strong contractions started this morning.  She reports positive fetal movement. She denies leakage of fluid, vaginal bleeding.   She plans on breast feeding. Her contraception plan is: Depo.  Prenatal History/Complications: PNC at [place]:  @[redacted]w[redacted]d , CWD, normal anatomy, cephalic presentation, anterior placenta, 62%ile, EFW [id number]  Pregnancy complications:  - none  Past Medical History: Past Medical History:  Diagnosis Date  . Medical history non-contributory   . Syncope 2009  . UTI (lower urinary tract infection)     Past Surgical History: Past Surgical History:  Procedure Laterality Date  . TOOTH EXTRACTION      Obstetrical History: OB History    Gravida  2   Para  1   Term  1   Preterm      AB      Living  1     SAB      TAB      Ectopic      Multiple  0   Live Births  1           Social History: Social History   Socioeconomic History  . Marital status: Single    Spouse name: Not on file  . Number of children: Not on file  . Years of education: Not on file  . Highest education level: Not on file  Occupational History  . Occupation: Ship broker  Tobacco Use  . Smoking status: Never Smoker  . Smokeless tobacco: Never Used  Substance and Sexual Activity  . Alcohol use: No    Alcohol/week: 0.0 standard drinks    Comment: very little, not while pregnant  . Drug use: No  . Sexual activity: Not Currently    Birth control/protection: I.U.D.  Other Topics Concern  . Not on file  Social History Narrative  . Not on file   Social Determinants of Health   Financial Resource Strain:   . Difficulty of Paying Living Expenses:   Food Insecurity: No Food Insecurity  . Worried About Charity fundraiser in the Last Year: Never true  . Ran Out of Food in the Last Year: Never true   Transportation Needs: No Transportation Needs  . Lack of Transportation (Medical): No  . Lack of Transportation (Non-Medical): No  Physical Activity:   . Days of Exercise per Week:   . Minutes of Exercise per Session:   Stress:   . Feeling of Stress :   Social Connections:   . Frequency of Communication with Friends and Family:   . Frequency of Social Gatherings with Friends and Family:   . Attends Religious Services:   . Active Member of Clubs or Organizations:   . Attends Archivist Meetings:   Marland Kitchen Marital Status:     Family History: No family history on file.  Allergies: No Known Allergies  Medications Prior to Admission  Medication Sig Dispense Refill Last Dose  . acetaminophen (TYLENOL) 500 MG tablet Take 250 mg by mouth every 6 (six) hours as needed for mild pain or headache.     . prenatal vitamin w/FE, FA (PRENATAL 1 + 1) 27-1 MG TABS tablet Take 1 tablet by mouth daily at 12 noon.        Review of Systems  All systems reviewed and negative except as stated in HPI  Physical Exam  Blood pressure 133/80, pulse 73, temperature 98.2 F (36.8 C), temperature source Oral, resp. rate 20, height 5\' 3"  (1.6 m), weight 67.1 kg, last menstrual period 06/01/2019, SpO2 100 %. General appearance: alert, oriented, uncomfortable appearing Lungs: normal respiratory effort Heart: regular rate Abdomen: soft, non-tender; gravid, leopolds 3100g Extremities: No calf swelling or tenderness Presentation: cephalic by RN SVE  Fetal monitoringBaseline: 135 bpm, Variability: Good {> 6 bpm), Accelerations: Reactive and Decelerations: Absent Uterine activity: regular q2-3 min     Prenatal labs: ABO, Rh: A/Positive/-- (04/19 1425) Antibody: Negative (04/19 1425) Rubella: 1.21 (04/19 1425) RPR: Non Reactive (04/19 1425)  HBsAg: Negative (04/19 1425)  HIV: Non Reactive (04/19 1425)  GC/Chlamydia: neg/neg 02/07/2020  GBS: Negative/-- (06/09 1111)  1-hr GTT: normal  12/18/2019 Genetic screening:  Low risk Panorama Anatomy 12/20/2019: normal  Prenatal Transfer Tool  Maternal Diabetes: No Genetic Screening: Normal Maternal Ultrasounds/Referrals: Normal Fetal Ultrasounds or other Referrals:  None Maternal Substance Abuse:  No Significant Maternal Medications:  None Significant Maternal Lab Results: Group B Strep negative  No results found for this or any previous visit (from the past 24 hour(s)).  Patient Active Problem List   Diagnosis Date Noted  . Normal labor 02/26/2020  . ASCUS of cervix with negative high risk HPV 02/23/2020  . Supervision of high risk pregnancy, antepartum 12/18/2019  . DUB (dysfunctional uterine bleeding) 07/12/2015  . IBS (irritable bowel syndrome) 01/10/2014    Assessment: Heidi Orr is a 24 y.o. G2P1001 at [redacted]w[redacted]d here for SOL.  #Labor: arrives 8-9 cm, expectant management #Pain: IV pain meds PRN, epidural upon request #FWB: Cat I #GBS/ID: negative #COVID: swab pending #MOF: breast #MOC: depo #Circ: yes   [redacted]w[redacted]d 02/26/2020, 10:34 AM

## 2020-02-26 NOTE — Discharge Summary (Addendum)
Postpartum Discharge Summary   Patient Name: Heidi Orr DOB: Sep 02, 1995 MRN: [id number]  Date of admission: 02/26/2020 Delivery date:02/26/2020  Delivering provider: Clarnce Flock  Date of discharge: 02/27/2020  Admitting diagnosis: Normal labor [O80, Z37.9] Intrauterine pregnancy: [redacted]w[redacted]d    Secondary diagnosis:  Active Problems:   Supervision of high risk pregnancy, antepartum   ASCUS of cervix with negative high risk HPV   Normal labor  Additional problems: None    Discharge diagnosis: Term Pregnancy Delivered                                              Post partum procedures: None Augmentation: N/A Complications: None  Hospital course: Onset of Labor With Vaginal Delivery      24y.o. yo G2P1001 at 388w4das admitted in Active Labor on 02/26/2020. Patient had an uncomplicated labor course as follows: patient arrived at 8-9 cm, quickly progressed to fully dilated and had uncomplicated NSVD. Membrane Rupture Time/Date: 7:00 AM ,02/26/2020   Delivery Method:Vaginal, Spontaneous  Episiotomy: None  Lacerations:  Periurethral  Patient had an uncomplicated postpartum course.  She is ambulating, tolerating a regular diet, passing flatus, and urinating well. Patient is discharged home in stable condition on 02/27/20.  Newborn Data: Birth date:02/26/2020  Birth time:11:51 AM  Gender:Female  Living status:Living  Apgars:9 ,9  Weight:3476 g   Magnesium Sulfate received: No BMZ received: No Rhophylac:N/A MMR:N/A T-DaP:Given prenatally Flu: Yes Transfusion:No  Physical exam  Vitals:   02/26/20 1511 02/26/20 2000 02/27/20 0005 02/27/20 0520  BP: 127/72 (!) 141/83 135/89 130/71  Pulse: 66 84 72 67  Resp: '18 17 18 17  ' Temp: 98.2 F (36.8 C) 98.7 F (37.1 C) 98.4 F (36.9 C) 98 F (36.7 C)  TempSrc: Oral Axillary Oral Oral  SpO2: 98% 99% 100% 99%  Weight:      Height:       General: alert, cooperative and no distress Lochia: appropriate Uterine Fundus:  firm Incision: N/A DVT Evaluation: No evidence of DVT seen on physical exam. No cords or calf tenderness. No significant calf/ankle edema. Labs: Lab Results  Component Value Date   WBC 10.7 (H) 02/26/2020   HGB 10.8 (L) 02/26/2020   HCT 33.9 (L) 02/26/2020   MCV 84.3 02/26/2020   PLT 268 02/26/2020   CMP Latest Ref Rng & Units 01/20/2016  Glucose 65 - 99 mg/dL 89  BUN 6 - 20 mg/dL 10  Creatinine 0.44 - 1.00 mg/dL 0.50  Sodium 135 - 145 mmol/L 137  Potassium 3.5 - 5.1 mmol/L 3.7  Chloride 101 - 111 mmol/L 102  CO2 19 - 32 mEq/L -  Calcium 8.4 - 10.5 mg/dL -  Total Protein 6.0 - 8.3 g/dL -  Total Bilirubin 0.2 - 1.2 mg/dL -  Alkaline Phos 47 - 119 U/L -  AST 0 - 37 U/L -  ALT 0 - 35 U/L -   Edinburgh Score: Edinburgh Postnatal Depression Scale Screening Tool 02/26/2020  I have been able to laugh and see the funny side of things. (No Data)     After visit meds:  Allergies as of 02/27/2020   No Known Allergies      Medication List     TAKE these medications    acetaminophen 500 MG tablet Commonly known as: TYLENOL Take 250 mg by mouth every 6 (six) hours as needed for  mild pain or headache.   ibuprofen 600 MG tablet Commonly known as: ADVIL Take 1 tablet (600 mg total) by mouth every 6 (six) hours.   prenatal vitamin w/FE, FA 27-1 MG Tabs tablet Take 1 tablet by mouth daily at 12 noon.         Discharge home in stable condition Infant Feeding: Breast Infant Disposition:home with mother Discharge instruction: per After Visit Summary and Postpartum booklet. Activity: Advance as tolerated. Pelvic rest for 6 weeks.  Diet: routine diet Future Appointments: Future Appointments  Date Time Provider [place]  02/29/2020  1:35 PM Woodroe Mode, MD [facility]  02/29/2020  3:15 PM [facility]   Follow up Visit:    Please schedule this patient for a Virtual postpartum visit in 6 weeks with the following provider: Any provider. Additional  Postpartum F/U: Schedule future depo shot   Low risk pregnancy complicated by:  n/a Delivery mode:  Vaginal, Spontaneous  Anticipated Birth Control:  Depo   02/27/2020 Eulis Foster, MD

## 2020-02-27 LAB — RPR: RPR Ser Ql: NONREACTIVE

## 2020-02-27 MED ORDER — IBUPROFEN 600 MG PO TABS: 600.0000 mg | ORAL_TABLET | Freq: Four times a day (QID) | ORAL | 0 refills | Status: DC

## 2020-02-27 NOTE — Lactation Note (Signed)
This note was copied from a baby's chart. Lactation Consultation Note  Patient Name: Heidi Orr [id number] Date: 02/27/2020 Reason for consult: Follow-up assessment  P2 mother whose infant is now 53 hours old.  This is an ETI at 38+4 weeks.  Mother was able to pump 20 mls of EBM after my last visit.  Returned at [facility] and baby was still swaddled and asleep post circumcision.    Mother attempted to breast feed him but he was too sleepy.  She also tried to spoon feed and finger feed some EBM but he was too sleepy.  Explained that I would like to attempt to bottle feed him now to ascertain his readiness for being discharged.  Mother agreeable.  Awakened baby and provided gentle stimulation.  Demonstrated paced bottle feeding using the purple extra slow flow nipple.  Baby continued to be very sleepy.  He would not grasp my gloved finger to suck.  With the nipple he required constant stimulation to stay awake and grasp the nipple.  It is hard to determine whether he is not sucking well due to the inability to suck well or it may be because he is still too sleepy from his circumcision.  Intermittently he would take a few good sucks but want to fall back to sleep.  With frequent breaks and back massage combined with cheek and jaw support he consumed 20 mls of mother's EBM in 20 minutes.  Burped well.  Swaddled him and asked mother to allow him to rest well until I return again for another feeding observation per NP request.  I will plan to visit at approximately 1430 for further assessment.  If he remains sleepy, I will wait an additional hour to evaluate.  NP informed and RN updated.   Maternal Data    Feeding Feeding Type: Bottle Fed - Breast Milk Nipple Type: Extra Slow Flow  LATCH Score                   Interventions    Lactation Tools Discussed/Used Tools: Bottle   Consult Status Consult Status: Follow-up Date: 02/27/20 Follow-up type: In-patient    Dora Sims 02/27/2020, 12:46 PM

## 2020-02-27 NOTE — Lactation Note (Signed)
This note was copied from a baby's chart. Lactation Consultation Note  Patient Name: Heidi Orr [id number] Date: 02/27/2020 Reason for consult: Follow-up assessment  P2 mother whose infant is now 86 hours old.  This is an ETI at 38+4 weeks.  Mother breast fed her first child (now 26 years old) for three months.  Baby has a 6% weight loss.  Baby was swaddled and asleep in the bassinet when I arrived.  Mother feels like he has been latching and feeding well so far.  Feeding times have been between 10-30 minutes.  Mother's breasts are small, soft and non tender and nipples are everted and intact.    Discussed baby's weight loss and suggested supplementation due to weight loss.  Offered to initiate the DEBP for mother to help stimulate breasts and increase milk supply.  Mother willing to pump.  Mother has the same brand of DEBP for home use and is familiar with the pump set up.  Reviewed settings and cleaning with her.  #24 flange size is appropriate at this time.  Mother denies pain with pumping.  Informed her of her options for further supplementation as needed.  Parents have a very strong desire to be discharged today.  Spoke with NP about the weight loss and will wait until she visits with the family to determine the exact feeding plan.  Mother will continue to feed 8-12 times/24 hours or sooner if baby shows feeding cues.  She will hand express and pump after every feeding with the DEBP.  Mother will feed back any EBM she obtains to baby.  RN updated.   Maternal Data    Feeding    LATCH Score                   Interventions    Lactation Tools Discussed/Used     Consult Status Consult Status: Follow-up Date: 02/28/20 Follow-up type: In-patient    [name] R [name] 02/27/2020, 10:59 AM

## 2020-02-27 NOTE — Lactation Note (Signed)
This note was copied from a baby's chart. Lactation Consultation Note  Patient Name: Heidi Orr [id number] Date: 02/27/2020 Reason for consult: Follow-up assessment Infant awake and cuing on arrival.  Told mom just to do what she usually does.  Mom attempting to breastfeed in cradle hold.  Infant struggling to latch.  Asked mom if I could show her something.  She agreed.  Showed mom how to switch her hands around and do the cross cradle hold.  He latched immediately. Explained to mom some babies need a little more help and stability in Beginning.  Kept reminding her to keep cheeks and chin touching breast and keep body hugged in close.  Infant breastfed 30 minutes on left breast and came off.  Tried to burp him. Then he went on left breast another 20 minutes then let go and came off.  Showed mom how to hand express and spoon feed him.LC got 21 ml of breastmilk via hand expression and feed back to him.  He was still cuing after this so mom put him back on first breast.  Mom has DEBP for home use but reports hand expression for her is easier.  Urged mom to continue to do some hand expression and spoon feeding past breastfeeding.  Discussed recommended supplement amounts. Urged to feed on cue and 8-12 or more times day.  Mom reports she feels comfortable going home. Urged mom to call lactation as needed.  Mom has breastfeeding Resouice list for home use.     LATCH Score Latch: Grasps breast easily, tongue down, lips flanged, rhythmical sucking.  Audible Swallowing: A few with stimulation  Type of Nipple: Everted at rest and after stimulation  Comfort (Breast/Nipple): Soft / non-tender  Hold (Positioning): Assistance needed to correctly position infant at breast and maintain latch.  LATCH Score: 8  Interventions Interventions: Assisted with latch;Hand express;Breast massage;Position options  Lactation Tools Discussed/Used     Consult Status Consult Status: Follow-up Date:  02/28/20 Follow-up type: In-patient    [facility] Michaelle Copas 02/27/2020, 5:23 PM

## 2020-02-27 NOTE — Discharge Instructions (Signed)

## 2020-02-29 ENCOUNTER — Encounter: Payer: Self-pay | Admitting: Obstetrics & Gynecology

## 2020-02-29 ENCOUNTER — Other Ambulatory Visit: Payer: Medicaid Other

## 2020-02-29 ENCOUNTER — Encounter: Payer: Medicaid Other | Admitting: Obstetrics & Gynecology

## 2020-02-29 DIAGNOSIS — Z419 Encounter for procedure for purposes other than remedying health state, unspecified: Secondary | ICD-10-CM | POA: Diagnosis not present

## 2020-03-28 ENCOUNTER — Encounter: Payer: Medicaid Other | Admitting: Obstetrics and Gynecology

## 2020-03-31 DIAGNOSIS — Z419 Encounter for procedure for purposes other than remedying health state, unspecified: Secondary | ICD-10-CM | POA: Diagnosis not present

## 2020-04-02 ENCOUNTER — Telehealth (INDEPENDENT_AMBULATORY_CARE_PROVIDER_SITE_OTHER): Payer: Medicaid Other | Admitting: Student

## 2020-04-02 ENCOUNTER — Other Ambulatory Visit: Payer: Self-pay

## 2020-04-02 ENCOUNTER — Encounter: Payer: Self-pay | Admitting: Student

## 2020-04-02 DIAGNOSIS — Z3009 Encounter for other general counseling and advice on contraception: Secondary | ICD-10-CM

## 2020-04-02 NOTE — Progress Notes (Signed)
Pt states that she wants Depo for Birth Control. Pt was not able to locate Blood Pressure Cuff, so asked if she can take it later & send reading thru My Chart.

## 2020-04-02 NOTE — Progress Notes (Signed)
     I connected with@ on 04/02/20 at  9:35 AM EDT by: Mychart video and verified that I am speaking with the correct person using two identifiers.  Patient is located at home and provider is located at [employer] [facility] [employer] [facility] [employer] [facility] .     The purpose of this virtual visit is to provide medical care while limiting exposure to the novel coronavirus. I discussed the limitations, risks, security and privacy concerns of performing an evaluation and management service by My chart and the availability of in person appointments. I also discussed with the patient that there may be a patient responsible charge related to this service. By engaging in this virtual visit, you consent to the provision of healthcare.  Additionally, you authorize for your insurance to be billed for the services provided during this visit.  The patient expressed understanding and agreed to proceed.  The following staff members participated in the virtual visit:  Corinda Gubler  Post Partum Visit Note Subjective:   Heidi Orr is a 24 y.o. [phone] female being evaluated for postpartum followup.  She is 5 weeks postpartum following a normal spontaneous vaginal delivery at  38/4 gestational weeks.  I have fully reviewed the prenatal and intrapartum course; pregnancy complicated by late to care.  Postpartum course has been . Baby is doing well. Baby is feeding by breast. Bleeding no bleeding. Bowel function is normal. Bladder function is normal. Patient is not sexually active. Contraception method is none. Postpartum depression screening: negative.   The pregnancy intention screening data noted above was reviewed. Potential methods of contraception were discussed. The patient elected to proceed with Hormonal Injection.   The following portions of the patient's history were reviewed and updated as appropriate: allergies, current medications, past family history, past medical history, past social history, past  surgical history and problem list.  Review of Systems Pertinent items are noted in HPI.   Objective:  There were no vitals filed for this visit. Self-Obtained       Assessment:    healthy postpartum exam.  Plan:  Essential components of care per ACOG recommendations:  1.  Mood and well being: Patient with negative depression screening today. Reviewed local resources for support.  - Patient does not use tobacco. - hx of drug use? No   2. Infant care and feeding:  -Patient currently breastmilk feeding? No  -Social determinants of health (SDOH) reviewed in EPIC. No concerns    3. Sexuality, contraception and birth spacing - Patient does not want a pregnancy in the next year.  Desired family size is 2 children.  - Reviewed forms of contraception in tiered fashion. Patient desired Depo-Provera today.   - Discussed birth spacing of 18 months  4. Sleep and fatigue -Encouraged family/partner/community support of 4 hrs of uninterrupted sleep to help with mood and fatigue  5. Physical Recovery  - Discussed patients delivery - Patient had a no degree laceration, perineal healing reviewed. Patient expressed understanding - Patient has urinary incontinence? No- Patient is safe to resume physical and sexual activity  6.  Health Maintenance - Last pap smear done April 2021and was abnormal with ASCUS with negative HPV.  7. Chronic Disease - PCP follow up  30 minutes of non-face-to-face time spent with the patient   -Message sent to Admin pool to schedule patient for nurse visit for Depo shot this week.   Marylene Land, CNM [facility] [employer], [facility]

## 2020-04-10 ENCOUNTER — Other Ambulatory Visit: Payer: Self-pay

## 2020-04-10 ENCOUNTER — Ambulatory Visit (INDEPENDENT_AMBULATORY_CARE_PROVIDER_SITE_OTHER): Payer: Medicaid Other

## 2020-04-10 VITALS — BP 110/75 | HR 81 | Wt 122.3 lb

## 2020-04-10 DIAGNOSIS — Z30013 Encounter for initial prescription of injectable contraceptive: Secondary | ICD-10-CM

## 2020-04-10 LAB — POCT PREGNANCY, URINE: Preg Test, Ur: NEGATIVE

## 2020-04-10 MED ORDER — MEDROXYPROGESTERONE ACETATE 150 MG/ML IM SUSP
150.0000 mg | Freq: Once | INTRAMUSCULAR | Status: AC
  Administered 2020-04-10: 150 mg via INTRAMUSCULAR

## 2020-04-10 NOTE — Progress Notes (Signed)
Chart reviewed for nurse visit. Agree with plan of care.  ° °Ollin Hochmuth N, PA-C °04/10/2020 1:18 PM  ° °

## 2020-04-10 NOTE — Progress Notes (Signed)
Heidi Orr here for initial Depo-Provera  Injection. UPT result today is negative. Pt denies intercourse since delivery. Injection administered without complication. Patient will return in 3 months for next injection.  Marjo Bicker, RN 04/10/2020  12:44 PM

## 2020-05-01 DIAGNOSIS — Z419 Encounter for procedure for purposes other than remedying health state, unspecified: Secondary | ICD-10-CM | POA: Diagnosis not present

## 2020-05-31 DIAGNOSIS — Z419 Encounter for procedure for purposes other than remedying health state, unspecified: Secondary | ICD-10-CM | POA: Diagnosis not present

## 2020-06-13 DIAGNOSIS — Z20822 Contact with and (suspected) exposure to covid-19: Secondary | ICD-10-CM | POA: Diagnosis not present

## 2020-06-26 ENCOUNTER — Ambulatory Visit: Payer: Medicaid Other

## 2020-07-01 DIAGNOSIS — Z419 Encounter for procedure for purposes other than remedying health state, unspecified: Secondary | ICD-10-CM | POA: Diagnosis not present

## 2020-07-31 DIAGNOSIS — Z419 Encounter for procedure for purposes other than remedying health state, unspecified: Secondary | ICD-10-CM | POA: Diagnosis not present

## 2020-08-31 DIAGNOSIS — Z419 Encounter for procedure for purposes other than remedying health state, unspecified: Secondary | ICD-10-CM | POA: Diagnosis not present

## 2020-10-01 DIAGNOSIS — Z419 Encounter for procedure for purposes other than remedying health state, unspecified: Secondary | ICD-10-CM | POA: Diagnosis not present

## 2020-10-29 DIAGNOSIS — Z419 Encounter for procedure for purposes other than remedying health state, unspecified: Secondary | ICD-10-CM | POA: Diagnosis not present

## 2020-11-29 DIAGNOSIS — Z419 Encounter for procedure for purposes other than remedying health state, unspecified: Secondary | ICD-10-CM | POA: Diagnosis not present

## 2020-12-29 DIAGNOSIS — Z419 Encounter for procedure for purposes other than remedying health state, unspecified: Secondary | ICD-10-CM | POA: Diagnosis not present

## 2021-01-29 DIAGNOSIS — Z419 Encounter for procedure for purposes other than remedying health state, unspecified: Secondary | ICD-10-CM | POA: Diagnosis not present

## 2021-02-07 ENCOUNTER — Emergency Department (HOSPITAL_COMMUNITY): Payer: Medicaid Other

## 2021-02-07 ENCOUNTER — Other Ambulatory Visit: Payer: Self-pay

## 2021-02-07 ENCOUNTER — Emergency Department (HOSPITAL_COMMUNITY)
Admission: EM | Admit: 2021-02-07 | Discharge: 2021-02-08 | Disposition: A | Discharge status: Home / Self Care | Payer: Medicaid Other | Attending: Emergency Medicine | Admitting: Emergency Medicine

## 2021-02-07 ENCOUNTER — Encounter (HOSPITAL_COMMUNITY): Payer: Self-pay | Admitting: *Deleted

## 2021-02-07 DIAGNOSIS — R079 Chest pain, unspecified: Secondary | ICD-10-CM | POA: Diagnosis not present

## 2021-02-07 DIAGNOSIS — R519 Headache, unspecified: Secondary | ICD-10-CM | POA: Diagnosis not present

## 2021-02-07 DIAGNOSIS — R000 Tachycardia, unspecified: Secondary | ICD-10-CM | POA: Diagnosis not present

## 2021-02-07 DIAGNOSIS — R202 Paresthesia of skin: Secondary | ICD-10-CM | POA: Diagnosis not present

## 2021-02-07 DIAGNOSIS — R0789 Other chest pain: Secondary | ICD-10-CM | POA: Diagnosis not present

## 2021-02-07 DIAGNOSIS — R112 Nausea with vomiting, unspecified: Secondary | ICD-10-CM | POA: Diagnosis not present

## 2021-02-07 DIAGNOSIS — R29810 Facial weakness: Secondary | ICD-10-CM | POA: Insufficient documentation

## 2021-02-07 LAB — COMPREHENSIVE METABOLIC PANEL
ALT: 18 U/L (ref 0–44)
AST: 20 U/L (ref 15–41)
Albumin: 4 g/dL (ref 3.5–5.0)
Alkaline Phosphatase: 98 U/L (ref 38–126)
Anion gap: 13 (ref 5–15)
BUN: 10 mg/dL (ref 6–20)
CO2: 22 mmol/L (ref 22–32)
Calcium: 9.4 mg/dL (ref 8.9–10.3)
Chloride: 104 mmol/L (ref 98–111)
Creatinine, Ser: 0.82 mg/dL (ref 0.44–1.00)
GFR, Estimated: >60 mL/min (ref >60)
Glucose, Bld: 95 mg/dL (ref 70–99)
Potassium: 3.2 mmol/L — ABNORMAL LOW (ref 3.5–5.1)
Sodium: 139 mmol/L (ref 135–145)
Total Bilirubin: 1.1 mg/dL (ref 0.3–1.2)
Total Protein: 7.4 g/dL (ref 6.5–8.1)

## 2021-02-07 LAB — CBC WITH DIFFERENTIAL/PLATELET
Abs Immature Granulocytes: 0.02 10*3/uL (ref 0.00–0.07)
Basophils Absolute: 0 10*3/uL (ref 0.0–0.1)
Basophils Relative: 0 %
Eosinophils Absolute: 0 10*3/uL (ref 0.0–0.5)
Eosinophils Relative: 0 %
HCT: 42.5 % (ref 36.0–46.0)
Hemoglobin: 14.1 g/dL (ref 12.0–15.0)
Immature Granulocytes: 0 %
Lymphocytes Relative: 11 %
Lymphs Abs: 0.8 10*3/uL (ref 0.7–4.0)
MCH: 28.3 pg (ref 26.0–34.0)
MCHC: 33.2 g/dL (ref 30.0–36.0)
MCV: 85.3 fL (ref 80.0–100.0)
Monocytes Absolute: 0.4 10*3/uL (ref 0.1–1.0)
Monocytes Relative: 6 %
Neutro Abs: 5.6 10*3/uL (ref 1.7–7.7)
Neutrophils Relative %: 83 %
Platelets: 225 10*3/uL (ref 150–400)
RBC: 4.98 MIL/uL (ref 3.87–5.11)
RDW: 12.5 % (ref 11.5–15.5)
WBC: 6.8 10*3/uL (ref 4.0–10.5)
nRBC: 0 % (ref 0.0–0.2)

## 2021-02-07 LAB — TROPONIN I (HIGH SENSITIVITY): Troponin I (High Sensitivity): 5 ng/L (ref <18)

## 2021-02-07 LAB — LIPASE, BLOOD: Lipase: 34 U/L (ref 11–51)

## 2021-02-07 NOTE — ED Triage Notes (Signed)
The pt was at a funeral or memorial service when she started having some chest pain  felt nervous and jittery at present  no chest pain   lmp  last week and then the next week

## 2021-02-07 NOTE — ED Provider Notes (Signed)
Emergency Medicine Provider Triage Evaluation Note  Heidi Orr , a 25 y.o. female  was evaluated in triage.  Pt complains of tingling to the bilat hands and to the face. Reports she had some chest pain/epigastric discomfort pta that has resolved. Denies sob.  Review of Systems  Positive: Tingling to bilat hands, chest pain/epigastric pain Negative: Fever, sob  Physical Exam  BP 122/82 (BP Location: Right Arm)   Pulse (!) 138   Temp 98 F (36.7 C) (Oral)   Resp 16   Ht 5\' 3"  (1.6 m)   Wt 55.5 kg   LMP 01/31/2021   SpO2 100%   BMI 21.67 kg/m  Gen:   Awake, no distress   Resp:  Normal effort  MSK:   Moves extremities without difficulty  Other:  Cranial nerves II-XII intact, 5/5 strength to the bue/ble. Lungs ctab, tachycardic. Abd soft and nontender  Medical Decision Making  Medically screening exam initiated at 8:12 PM.  Appropriate orders placed.  Heidi Orr was informed that the remainder of the evaluation will be completed by another provider, this initial triage assessment does not replace that evaluation, and the importance of remaining in the ED until their evaluation is complete.     Heidi Orr 02/07/21 2014    04/09/21, MD 02/08/21 1454

## 2021-02-08 ENCOUNTER — Emergency Department (HOSPITAL_COMMUNITY): Payer: Medicaid Other

## 2021-02-08 DIAGNOSIS — R202 Paresthesia of skin: Secondary | ICD-10-CM | POA: Diagnosis not present

## 2021-02-08 DIAGNOSIS — R29810 Facial weakness: Secondary | ICD-10-CM | POA: Diagnosis not present

## 2021-02-08 DIAGNOSIS — R079 Chest pain, unspecified: Secondary | ICD-10-CM | POA: Diagnosis not present

## 2021-02-08 LAB — TROPONIN I (HIGH SENSITIVITY): Troponin I (High Sensitivity): 3 ng/L (ref <18)

## 2021-02-08 LAB — T4, FREE: Free T4: 0.78 ng/dL (ref 0.61–1.12)

## 2021-02-08 LAB — I-STAT BETA HCG BLOOD, ED (MC, WL, AP ONLY): I-stat hCG, quantitative: <5 m[IU]/mL (ref <5)

## 2021-02-08 LAB — D-DIMER, QUANTITATIVE: D-Dimer, Quant: 1.66 ug/mL-FEU — ABNORMAL HIGH (ref 0.00–0.50)

## 2021-02-08 LAB — TSH: TSH: 5.163 u[IU]/mL — ABNORMAL HIGH (ref 0.350–4.500)

## 2021-02-08 MED ORDER — IOHEXOL 350 MG/ML SOLN
50.0000 mL | Freq: Once | INTRAVENOUS | Status: AC | PRN
  Administered 2021-02-08: 50 mL via INTRAVENOUS

## 2021-02-08 NOTE — Discharge Instructions (Signed)
You were seen in the emergency department for nausea, vomiting, headache, tingling sensation in your face and extremities, reported right facial drooping  Your heart rate was elevated in triage but this normalized without interventions.  Other extensive laboratory, imaging work-up was initiated in the emergency department.  Everything we have done has been reassuring.  The cause of your symptoms is unclear.  Monitor for any return of symptoms.  Call your primary care doctor to discuss your symptoms further, they may place referral for other specialists for further evaluation

## 2021-02-08 NOTE — ED Provider Notes (Signed)
[facility]   CSN: [id number] Arrival date & time: 02/07/21  1930     History Chief Complaint  Patient presents with   Chest Pain    Heidi Orr is a 25 y.o. female with pertinent past medical history of recent normal spontaneous vaginal delivery in 2021 presents to the ED for evaluation of several complaints including chest pain, diffuse body tingling, nausea, vomiting, headache, right facial weakness and drooping.  History obtained from patient as well as mother at bedside.  Around 1230 yesterday patient developed sudden onset nausea and vomited once.  She had a "little headache".  She was at her house not doing much.  Her nausea and vomiting has completely resolved since then.  Had no abdominal pain, diarrhea, constipation, dysuria, hematuria.  No hematemesis.  At around 6:30 PM yesterday she was at a memorial/funeral when she developed sudden onset chest discomfort described as sharp, stabbing, central underneath her breastbone.  Nonradiating.  It lasted for about 1 minute and only happened once.  It was brief so she does not know if it was positional, pleuritic.  No longer having any chest discomfort.  No recent cough, fever, hemoptysis.  No shortness of breath. No history of blood clots, hormone therapy, recent surgery or prolonged immobilization, leg swelling or calf pain.  Had associated wheezy dizzy feeling in her head.  She developed numbness and tingling first on the right side of her face, right arm and right leg and then it went to the left side of her face and arm and leg.  She also felt like both of her hands were "drawing in" and curling in even though she would try to stretch them out her fingers just kept curling back in.  This tingling, numbness lasted for several hours but states now it is completely resolved.  Her mother made her smile and noted the right corner of her lip did not move and was drooping.  She doesn't know  if this involved the forehead.  This lasted for about 5 to 10 minutes.  Denies having any headache at that time.  No double vision, loss of vision.  Denies any extremity weakness and states she just had tingling and numbness bilaterally.  States she was not particularly anxious or under any emotional stress. No history of anxiety. No history of similar in the past.   Of note - mother is very concerned about a stroke as well as a heart attack due to family history.  Mother also states that she has an "abnormal heart rate".  She came to the ED a few years ago and heart rate was 245.  She is on metoprolol.  She apparently was told by her heart doctor that this abnormal heart rate is inherited.  She does not know the name of what this heart rate is called and SVT and atrial fibrillation did not sound familiar to her.  She is very concerned about her daughter and wants "everything that can be done "be done for her.  HPI     Past Medical History:  Diagnosis Date   Medical history non-contributory    Syncope 2009   UTI (lower urinary tract infection)     Patient Active Problem List   Diagnosis Date Noted   Normal labor 02/26/2020   ASCUS of cervix with negative high risk HPV 02/23/2020   DUB (dysfunctional uterine bleeding) 07/12/2015   IBS (irritable bowel syndrome) 01/10/2014    Past Surgical History:  Procedure Laterality Date   TOOTH EXTRACTION       OB History     Gravida  2   Para  2   Term  2   Preterm      AB      Living  2      SAB      IAB      Ectopic      Multiple  0   Live Births  2           History reviewed. No pertinent family history.  Social History   Tobacco Use   Smoking status: Never   Smokeless tobacco: Never  Substance Use Topics   Alcohol use: No    Alcohol/week: 0.0 standard drinks    Comment: very little, not while pregnant   Drug use: No    Home Medications Prior to Admission medications   Medication Sig Start Date End  Date Taking? Authorizing Provider  ibuprofen (ADVIL) 600 MG tablet Take 1 tablet (600 mg total) by mouth every 6 (six) hours. Patient not taking: No sig reported 02/27/20   Simmons-Robinson, Tawanna Cooler, MD    Allergies    Patient has no known allergies.  Review of Systems   Review of Systems  Cardiovascular:  Positive for chest pain.  Gastrointestinal:  Positive for nausea and vomiting.  Neurological:  Positive for dizziness, weakness (facial) and headaches.       Tingling   Physical Exam Updated Vital Signs BP 115/79   Pulse 88   Temp 98.2 F (36.8 C) (Oral)   Resp 14   Ht 5\' 3"  (1.6 m)   Wt 55.5 kg   LMP 01/31/2021   SpO2 99%   BMI 21.67 kg/m   Physical Exam Constitutional:      Appearance: She is well-developed.     Comments: NAD. Non toxic.   HENT:     Head: Normocephalic and atraumatic.     Nose: Nose normal.  Eyes:     General: Lids are normal.     Conjunctiva/sclera: Conjunctivae normal.  Neck:     Trachea: Trachea normal.     Comments: Trachea midline.  Cardiovascular:     Rate and Rhythm: Normal rate and regular rhythm.     Pulses:          Radial pulses are 1+ on the right side and 1+ on the left side.       Dorsalis pedis pulses are 1+ on the right side and 1+ on the left side.     Heart sounds: Normal heart sounds, S1 normal and S2 normal.     Comments: No murmurs. No LE edema or calf tenderness.  Pulmonary:     Effort: Pulmonary effort is normal.     Breath sounds: Normal breath sounds.  Abdominal:     General: Bowel sounds are normal.     Palpations: Abdomen is soft.     Tenderness: There is no abdominal tenderness.     Comments: No epigastric or upper abdominal tenderness.  Musculoskeletal:     Cervical back: Normal range of motion.  Skin:    General: Skin is warm and dry.     Capillary Refill: Capillary refill takes less than 2 seconds.     Comments: No rash to chest wall  Neurological:     Mental Status: She is alert.     GCS: GCS eye  subscore is 4. GCS verbal subscore is 5. GCS motor subscore is 6.  Comments:   Mental Status: Patient is awake, alert, oriented to person, place, year, and situation. Patient is able to give a clear and coherent history. Speech is fluent and clear without dysarthria or aphasia. No signs of neglect.  Cranial Nerves: I not tested II visual fields full bilaterally. PERRL.   III, IV, VI EOMs intact without ptosis V sensation to light touch intact in all 3 divisions of trigeminal nerve bilaterally  VII facial movements symmetric bilaterally VIII hearing intact to voice/conversation  IX, X no uvula deviation, symmetric rise of soft palate/uvula XI 5/5 SCM and trapezius strength bilaterally  XII tongue protrusion midline, symmetric L/R movements  Motor: Strength 5/5 in upper/lower extremities.  Sensation to light touch intact in face, upper/lower extremities. No pronator drift. No leg drop.  Cerebellar: No ataxia with finger to nose.  Steady gait. No truncal sway. Normal Romberg.   Psychiatric:        Speech: Speech normal.        Behavior: Behavior normal.        Thought Content: Thought content normal.    ED Results / Procedures / Treatments   Labs (all labs ordered are listed, but only abnormal results are displayed) Labs Reviewed  COMPREHENSIVE METABOLIC PANEL - Abnormal; Notable for the following components:      Result Value   Potassium 3.2 (*)    All other components within normal limits  D-DIMER, QUANTITATIVE - Abnormal; Notable for the following components:   D-Dimer, Quant 1.66 (*)    All other components within normal limits  TSH - Abnormal; Notable for the following components:   TSH 5.163 (*)    All other components within normal limits  CBC WITH DIFFERENTIAL/PLATELET  LIPASE, BLOOD  T4, FREE  I-STAT BETA HCG BLOOD, ED ([facility], [facility], [facility] ONLY)  TROPONIN I (HIGH SENSITIVITY)  TROPONIN I (HIGH SENSITIVITY)    EKG None  Radiology DG Chest 2 View  Result Date:  02/07/2021 CLINICAL DATA:  Centralized chest pain. EXAM: CHEST - 2 VIEW COMPARISON:  None. FINDINGS: The heart size and mediastinal contours are within normal limits. Both lungs are clear. The visualized skeletal structures are unremarkable. IMPRESSION: No active cardiopulmonary disease. Electronically Signed   By: Gerome Sam III M.D   On: 02/07/2021 20:53   CT Angio Chest PE W and/or Wo Contrast  Result Date: 02/08/2021 CLINICAL DATA:  Chest pain with abnormal D-dimer EXAM: CT ANGIOGRAPHY CHEST WITH CONTRAST TECHNIQUE: Multidetector CT imaging of the chest was performed using the standard protocol during bolus administration of intravenous contrast. Multiplanar CT image reconstructions and MIPs were obtained to evaluate the vascular anatomy. CONTRAST:  41mL OMNIPAQUE IOHEXOL 350 MG/ML SOLN COMPARISON:  None. FINDINGS: Cardiovascular: Satisfactory opacification of the pulmonary arteries to the segmental level. No evidence of pulmonary embolism. Normal heart size. No pericardial effusion. Mediastinum/Nodes: No enlarged mediastinal, hilar, or axillary lymph nodes. Thyroid gland, trachea, and esophagus demonstrate no significant findings. Lungs/Pleura: Lungs are clear. No pleural effusion or pneumothorax. Upper Abdomen: No acute abnormality. Musculoskeletal: No chest wall abnormality. No acute or significant osseous findings. Review of the MIP images confirms the above findings. IMPRESSION: Negative for acute PE, pneumonia or other acute cardiopulmonary process. Electronically Signed   By: Malachy Moan M.D.   On: 02/08/2021 12:13   MR BRAIN WO CONTRAST  Result Date: 02/08/2021 CLINICAL DATA:  Diffuse tingling, now resolved right facial droop EXAM: MRI HEAD WITHOUT CONTRAST TECHNIQUE: Multiplanar, multiecho pulse sequences of the brain and surrounding structures were obtained  without intravenous contrast. COMPARISON:  None. FINDINGS: Brain: There is no acute infarction or intracranial hemorrhage. There  is no intracranial mass, mass effect, or edema. There is no hydrocephalus or extra-axial fluid collection. Ventricles and sulci are normal in size and configuration. Vascular: Major vessel flow voids at the skull base are preserved. Skull and upper cervical spine: Normal marrow signal is preserved. Sinuses/Orbits: Trace paranasal sinus mucosal thickening. Orbits are unremarkable. Other: Sella is unremarkable.  Mastoid air cells are clear. IMPRESSION: No evidence of recent infarction, hemorrhage, or mass. Electronically Signed   By: Guadlupe Spanish M.D.   On: 02/08/2021 11:00    Procedures Procedures   Medications Ordered in ED Medications  iohexol (OMNIPAQUE) 350 MG/ML injection 50 mL (50 mLs Intravenous Contrast Given 02/08/21 1152)    ED Course  I have reviewed the triage vital signs and the nursing notes.  Pertinent labs & imaging results that were available during my care of the patient were reviewed by me and considered in my medical decision making (see chart for details).  Clinical Course as of 02/08/21 1354  Sat Feb 08, 2021  0951 D-Dimer, Quant(!): 1.66 [CG]    Clinical Course User Index [CG] Liberty Handy, PA-C   MDM Rules/Calculators/A&P                           25 y.o. yo female presents to the ED for several complaints including nausea, vomiting, headache, chest pain, diffuse facial and body tingling.  Per mother who is at bedside states she saw her right corner of her mouth drooping which lasted 5 to 10 minutes.  Additional information obtained from chart, nursing and triage notes review  Chart review reveals -no pertinent past medical history, lab work or imaging  Initial work-up was ordered by triage/MSE provider.  Ordered lab, imaging were personally reviewed and interpreted  Overall my suspicion for primary cardiac etiology, PE was very low.  She had a fleeting episode of chest pain with no shortness of breath, hypoxia,.  However her heart rate was 138 in  triage, this has completely normalized without any intervention.  Additionally, patient symptoms including facial tingling, hand/finger "curling" and cramping are highly atypical for stroke, TIA.  Her mother however was extremely concerned because cardiac problems and strokes run in the family.  Given initial heart rate of 138, atypical chest pain and patient/mother concerned I added a D-dimer as well as an MRI of the brain.  Prior to this though I did try to explain to patient and mother that the likelihood of a PE, dissection, stroke were highly unlikely however they persisted and requested further testing here today.  Hard to defer further work-up given the witnessed facial drooping/weakness as well as heart rate was 138 and initial tachycardia upon arrival.  Labs reveal -D-dimer 1.66.  TSH 5.2 with normal T4.  Undetectable Trope x2.  Imaging reveals -EKG shows sinus tachycardia with heart rate 134 and questionable ST abnormalities however likely this is rate related.  Repeat EKG normal. CTA is negative for any acute findings, PE, cardiopulmonary acute process.  MRI of the brain is unremarkable.  Chest x-ray is unremarkable.   Medicines ordered -none  ED course & MDM  1230: Patient's tachycardia has completely normalized without any intervention.  She is now asymptomatic.  Discussed at length lab work, imaging results are all unremarkable and etiology of patient's symptoms is unknown at this time.  Recommended discharge with observation for  return of symptoms.  Recommended PCP follow-up for further discussion of this.  Patient's mother is still concerned about a possibility of cardiac problem or stroke due to family history however attempted to reassure her that extensive work-up in the ED is reassuring.  They are comfortable being discharged with PCP follow-up.   Portions of this note were generated with Scientist, clinical (histocompatibility and immunogenetics). Dictation errors may occur despite best attempts at proofreading    Final Clinical Impression(s) / ED Diagnoses Final diagnoses:  Non-cardiac chest pain  Facial tingling sensation  Tingling in extremities  Sinus tachycardia    Rx / DC Orders ED Discharge Orders     None        Liberty Handy, PA-C 02/08/21 1354    Gilda Crease, MD 02/08/21 [phone]

## 2021-02-08 NOTE — ED Notes (Signed)
Patient Alert and oriented to baseline. Stable and ambulatory to baseline. Patient verbalized understanding of the discharge instructions.  Patient belongings were taken by the patient.   

## 2021-02-08 NOTE — ED Notes (Signed)
Patient transported to MRI 

## 2021-02-10 ENCOUNTER — Telehealth: Payer: Self-pay | Admitting: Family Medicine

## 2021-02-10 NOTE — Telephone Encounter (Signed)
Patient seen in [place] ED for stroke symptoms 02/07/21. Pt d/c Saturday 02/08/21 Pt states that she went in with Tingling in face, nose, mouth, down right side of body - Patient had EKG, D-Dimer and MRI. D-Dimer was elevated - xrays normal.   They advised her to follow up with PCP for possible referral to Neurology.  First available appt for ED f/u is 02/25/21 with Dr Ermalene Searing.   Please advise, thanks.

## 2021-02-11 NOTE — Telephone Encounter (Signed)
Noted  

## 2021-02-11 NOTE — Telephone Encounter (Signed)
Spoke with [employer].  She states she has felt okay the last couple of days.  Just a little sluggish.   She has bought an Apple watch to monitor her heart rate.  She felt waiting to the 02/25/21 to see Dr. Ermalene Searing was fine.  She will call back if things changes.  ER precautions given.

## 2021-02-25 ENCOUNTER — Ambulatory Visit (INDEPENDENT_AMBULATORY_CARE_PROVIDER_SITE_OTHER): Payer: Medicaid Other | Admitting: Family Medicine

## 2021-02-25 ENCOUNTER — Other Ambulatory Visit: Payer: Self-pay

## 2021-02-25 ENCOUNTER — Encounter: Payer: Self-pay | Admitting: Family Medicine

## 2021-02-25 VITALS — BP 100/64 | HR 81 | Temp 98.2°F | Ht 63.0 in | Wt 117.5 lb

## 2021-02-25 DIAGNOSIS — R202 Paresthesia of skin: Secondary | ICD-10-CM

## 2021-02-25 DIAGNOSIS — Z30013 Encounter for initial prescription of injectable contraceptive: Secondary | ICD-10-CM

## 2021-02-25 DIAGNOSIS — R200 Anesthesia of skin: Secondary | ICD-10-CM | POA: Diagnosis not present

## 2021-02-25 DIAGNOSIS — R002 Palpitations: Secondary | ICD-10-CM | POA: Diagnosis not present

## 2021-02-25 LAB — POCT URINE PREGNANCY: Preg Test, Ur: NEGATIVE

## 2021-02-25 NOTE — Patient Instructions (Signed)
Keep up with water intake, do not skip meals.  Stop caffeine.  If heart rate racing continue after 1 months.. call for cardiology referral.

## 2021-02-25 NOTE — Assessment & Plan Note (Signed)
She does seem to have intermittent palpitations.Marland Kitchen stop caffeine, increase water and follow... if persistent will refer to cardiology.

## 2021-02-25 NOTE — Addendum Note (Signed)
Addended by: Damita Lack on: 02/25/2021 10:21 AM   Modules accepted: Orders

## 2021-02-25 NOTE — Assessment & Plan Note (Signed)
Now resolved. Extensive workup negative including MRI brain.  May have been related to emotional distress, dehydration, skipping meal. Normal neuro exam today.

## 2021-02-25 NOTE — Progress Notes (Signed)
Patient ID: Heidi Orr, female    DOB: 11/22/1995, 25 y.o.   MRN: [id number]  This visit was conducted in person.  BP 100/64   Pulse 81   Temp 98.2 F (36.8 C) (Temporal)   Ht 5\' 3"  (1.6 m)   Wt 117 lb 8 oz (53.3 kg)   LMP 01/31/2021   SpO2 98%   BMI 20.81 kg/m    CC: Chief Complaint  Patient presents with   Follow-up    [facility] ED 02/07/21    Subjective:   HPI: Heidi Orr is a 25 y.o. female presenting on 02/25/2021 for Follow-up [facility] ED 02/07/21)   She reports she   in normal state of health until day of ER visit.04/09/21 started with nausea, dizziness in AM but felt better.   Later in day  at funeral ( she was very close to person  passed).. started feeling  claustrophobic, felt pressure pushing her down.  Went outside.  Hands started feeling tight, tingling around  right side of mouth and nose. Tingling in bilateral arms to elbows.  Entire  right leg tingling and weak.. kind of asleep.  No lightheaded.  Blood sugar normal 110. Chest pressure on breast bone.. no SOB. No heart racing.  Ate 6 hours prior to episode... did have some crackers and water.   Symptoms lasted 8 hours.   At ER: Labs reveal -D-dimer 1.66.  TSH 5.2 with normal T4.  Undetectable Trope x2. EKG shows sinus tachycardia with heart rate 134 and questionable ST abnormalities however likely this is rate related.  Repeat EKG normal. CTA is negative for any acute findings, PE, cardiopulmonary acute process.  MRI of the brain is unremarkable.  Chest x-ray is unremarkable.   She reports that a few days following she felt tired, but no other symptoms. Boyfriend noted that her heart rate has been increased when out to lunch with a lot of people... up to 134.  She states she did not feel anxious.            Relevant past medical, surgical, family and social history reviewed and updated as indicated. Interim medical history since our last visit reviewed. Allergies and medications reviewed and  updated. Outpatient Medications Prior to Visit  Medication Sig Dispense Refill   ibuprofen (ADVIL) 600 MG tablet Take 1 tablet (600 mg total) by mouth every 6 (six) hours. (Patient not taking: No sig reported) 30 tablet 0   No facility-administered medications prior to visit.     Per HPI unless specifically indicated in ROS section below Review of Systems  Constitutional:  Negative for fatigue and fever.  HENT:  Negative for congestion.   Eyes:  Negative for pain.  Respiratory:  Negative for cough and shortness of breath.   Cardiovascular:  Negative for chest pain, palpitations and leg swelling.  Gastrointestinal:  Negative for abdominal pain.  Genitourinary:  Negative for dysuria and vaginal bleeding.  Musculoskeletal:  Negative for back pain.  Neurological:  Negative for syncope, light-headedness and headaches.  Psychiatric/Behavioral:  Negative for dysphoric mood.   Objective:  BP 100/64   Pulse 81   Temp 98.2 F (36.8 C) (Temporal)   Ht 5\' 3"  (1.6 m)   Wt 117 lb 8 oz (53.3 kg)   LMP 01/31/2021   SpO2 98%   BMI 20.81 kg/m   Wt Readings from Last 3 Encounters:  02/25/21 117 lb 8 oz (53.3 kg)  02/07/21 122 lb 5.7 oz (55.5 kg)  04/10/20 122  lb 4.8 oz (55.5 kg)      Physical Exam Constitutional:      General: She is not in acute distress.    Appearance: Normal appearance. She is well-developed. She is not ill-appearing or toxic-appearing.  HENT:     Head: Normocephalic.     Right Ear: Hearing, tympanic membrane, ear canal and external ear normal. Tympanic membrane is not erythematous, retracted or bulging.     Left Ear: Hearing, tympanic membrane, ear canal and external ear normal. Tympanic membrane is not erythematous, retracted or bulging.     Nose: No mucosal edema or rhinorrhea.     Right Sinus: No maxillary sinus tenderness or frontal sinus tenderness.     Left Sinus: No maxillary sinus tenderness or frontal sinus tenderness.     Mouth/Throat:     Pharynx: Uvula  midline.  Eyes:     General: Lids are normal. Lids are everted, no foreign bodies appreciated.     Conjunctiva/sclera: Conjunctivae normal.     Pupils: Pupils are equal, round, and reactive to light.  Neck:     Thyroid: No thyroid mass or thyromegaly.     Vascular: No carotid bruit.     Trachea: Trachea normal.  Cardiovascular:     Rate and Rhythm: Normal rate and regular rhythm.     Pulses: Normal pulses.     Heart sounds: Normal heart sounds, S1 normal and S2 normal. No murmur heard.   No friction rub. No gallop.  Pulmonary:     Effort: Pulmonary effort is normal. No tachypnea or respiratory distress.     Breath sounds: Normal breath sounds. No decreased breath sounds, wheezing, rhonchi or rales.  Abdominal:     General: Bowel sounds are normal.     Palpations: Abdomen is soft.     Tenderness: There is no abdominal tenderness.  Musculoskeletal:     Cervical back: Normal range of motion and neck supple.  Skin:    General: Skin is warm and dry.     Findings: No rash.  Neurological:     Mental Status: She is alert and oriented to person, place, and time.     GCS: GCS eye subscore is 4. GCS verbal subscore is 5. GCS motor subscore is 6.     Cranial Nerves: No cranial nerve deficit.     Sensory: No sensory deficit.     Motor: No abnormal muscle tone.     Coordination: Coordination normal.     Gait: Gait normal.     Deep Tendon Reflexes: Reflexes are normal and symmetric.     Comments: Nml cerebellar exam   No papilledema  Psychiatric:        Mood and Affect: Mood is not anxious or depressed.        Speech: Speech normal.        Behavior: Behavior normal. Behavior is cooperative.        Thought Content: Thought content normal.        Cognition and Memory: Memory is not impaired. She does not exhibit impaired recent memory or impaired remote memory.        Judgment: Judgment normal.      Results for orders placed or performed during the hospital encounter of 02/07/21  CBC  with Differential  Result Value Ref Range   WBC 6.8 4.0 - 10.5 K/uL   RBC 4.98 3.87 - 5.11 MIL/uL   Hemoglobin 14.1 12.0 - 15.0 g/dL   HCT 08.6 76.1 - 95.0 %  MCV 85.3 80.0 - 100.0 fL   MCH 28.3 26.0 - 34.0 pg   MCHC 33.2 30.0 - 36.0 g/dL   RDW 37.3 42.8 - 76.8 %   Platelets 225 150 - 400 K/uL   nRBC 0.0 0.0 - 0.2 %   Neutrophils Relative % 83 %   Neutro Abs 5.6 1.7 - 7.7 K/uL   Lymphocytes Relative 11 %   Lymphs Abs 0.8 0.7 - 4.0 K/uL   Monocytes Relative 6 %   Monocytes Absolute 0.4 0.1 - 1.0 K/uL   Eosinophils Relative 0 %   Eosinophils Absolute 0.0 0.0 - 0.5 K/uL   Basophils Relative 0 %   Basophils Absolute 0.0 0.0 - 0.1 K/uL   Immature Granulocytes 0 %   Abs Immature Granulocytes 0.02 0.00 - 0.07 K/uL  Comprehensive metabolic panel  Result Value Ref Range   Sodium 139 135 - 145 mmol/L   Potassium 3.2 (L) 3.5 - 5.1 mmol/L   Chloride 104 98 - 111 mmol/L   CO2 22 22 - 32 mmol/L   Glucose, Bld 95 70 - 99 mg/dL   BUN 10 6 - 20 mg/dL   Creatinine, Ser 1.15 0.44 - 1.00 mg/dL   Calcium 9.4 8.9 - 72.6 mg/dL   Total Protein 7.4 6.5 - 8.1 g/dL   Albumin 4.0 3.5 - 5.0 g/dL   AST 20 15 - 41 U/L   ALT 18 0 - 44 U/L   Alkaline Phosphatase 98 38 - 126 U/L   Total Bilirubin 1.1 0.3 - 1.2 mg/dL   GFR, Estimated >20 >35 mL/min   Anion gap 13 5 - 15  Lipase, blood  Result Value Ref Range   Lipase 34 11 - 51 U/L  D-dimer, quantitative  Result Value Ref Range   D-Dimer, Quant 1.66 (H) 0.00 - 0.50 ug/mL-FEU  TSH  Result Value Ref Range   TSH 5.163 (H) 0.350 - 4.500 uIU/mL  T4, free  Result Value Ref Range   Free T4 0.78 0.61 - 1.12 ng/dL  I-Stat beta hCG blood, ED  Result Value Ref Range   I-stat hCG, quantitative <5.0 <5 mIU/mL   Comment 3          Troponin I (High Sensitivity)  Result Value Ref Range   Troponin I (High Sensitivity) 5 <18 ng/L  Troponin I (High Sensitivity)  Result Value Ref Range   Troponin I (High Sensitivity) 3 <18 ng/L    This visit occurred  during the SARS-CoV-2 public health emergency.  Safety protocols were in place, including screening questions prior to the visit, additional usage of staff PPE, and extensive cleaning of exam room while observing appropriate contact time as indicated for disinfecting solutions.   COVID 19 screen:  No recent travel or known exposure to COVID19 The patient denies respiratory symptoms of COVID 19 at this time. The importance of social distancing was discussed today.   Assessment and Plan    Problem List Items Addressed This Visit     Numbness and tingling    Now resolved. Extensive workup negative including MRI brain.  May have been related to emotional distress, dehydration, skipping meal. Normal neuro exam today.       Palpitations - Primary     She does seem to have intermittent palpitations.Marland Kitchen stop caffeine, increase water and follow... if persistent will refer to cardiology.       Wishes to restart depo... Upreg with repeat in 2 weeks.  Kerby Nora, MD

## 2021-02-28 DIAGNOSIS — Z419 Encounter for procedure for purposes other than remedying health state, unspecified: Secondary | ICD-10-CM | POA: Diagnosis not present

## 2021-03-11 ENCOUNTER — Ambulatory Visit (INDEPENDENT_AMBULATORY_CARE_PROVIDER_SITE_OTHER): Payer: Medicaid Other

## 2021-03-11 ENCOUNTER — Other Ambulatory Visit: Payer: Self-pay

## 2021-03-11 DIAGNOSIS — Z3042 Encounter for surveillance of injectable contraceptive: Secondary | ICD-10-CM

## 2021-03-11 LAB — POCT URINE PREGNANCY: Preg Test, Ur: NEGATIVE

## 2021-03-11 MED ORDER — MEDROXYPROGESTERONE ACETATE 150 MG/ML IM SUSP
150.0000 mg | Freq: Once | INTRAMUSCULAR | Status: AC
Start: 2021-03-11 — End: 2021-03-11
  Administered 2021-03-11: 150 mg via INTRAMUSCULAR

## 2021-03-11 NOTE — Progress Notes (Signed)
Per orders of Dr. Ermalene Searing, injection of Depo-Provera given by Eual Fines, CMA in LUOQ after confirming negative urine pregnancy test.  Patient tolerated injection well.  Pt given dates for next injection of September 27 to October 11.

## 2021-03-31 DIAGNOSIS — Z419 Encounter for procedure for purposes other than remedying health state, unspecified: Secondary | ICD-10-CM | POA: Diagnosis not present

## 2021-05-01 DIAGNOSIS — Z419 Encounter for procedure for purposes other than remedying health state, unspecified: Secondary | ICD-10-CM | POA: Diagnosis not present

## 2021-05-29 ENCOUNTER — Ambulatory Visit (INDEPENDENT_AMBULATORY_CARE_PROVIDER_SITE_OTHER): Payer: Medicaid Other

## 2021-05-29 ENCOUNTER — Other Ambulatory Visit: Payer: Self-pay

## 2021-05-29 DIAGNOSIS — Z3042 Encounter for surveillance of injectable contraceptive: Secondary | ICD-10-CM | POA: Diagnosis not present

## 2021-05-29 MED ORDER — MEDROXYPROGESTERONE ACETATE 150 MG/ML IM SUSP
150.0000 mg | Freq: Once | INTRAMUSCULAR | Status: AC
Start: 2021-05-29 — End: 2021-05-29
  Administered 2021-05-29: 150 mg via INTRAMUSCULAR

## 2021-05-29 NOTE — Progress Notes (Signed)
Per orders of Dr. Ermalene Searing, injection of Depo-Provera given by Eual Fines, CMA in RUOQ.  Patient tolerated injection well.  Calendar given to pt to with next date range of December 15-29.

## 2021-05-31 DIAGNOSIS — Z419 Encounter for procedure for purposes other than remedying health state, unspecified: Secondary | ICD-10-CM | POA: Diagnosis not present

## 2021-07-01 DIAGNOSIS — Z419 Encounter for procedure for purposes other than remedying health state, unspecified: Secondary | ICD-10-CM | POA: Diagnosis not present

## 2021-07-31 DIAGNOSIS — Z419 Encounter for procedure for purposes other than remedying health state, unspecified: Secondary | ICD-10-CM | POA: Diagnosis not present

## 2021-08-21 ENCOUNTER — Ambulatory Visit (INDEPENDENT_AMBULATORY_CARE_PROVIDER_SITE_OTHER): Payer: Medicaid Other

## 2021-08-21 ENCOUNTER — Other Ambulatory Visit: Payer: Self-pay

## 2021-08-21 DIAGNOSIS — Z3042 Encounter for surveillance of injectable contraceptive: Secondary | ICD-10-CM | POA: Diagnosis not present

## 2021-08-21 MED ORDER — MEDROXYPROGESTERONE ACETATE 150 MG/ML IM SUSP
150.0000 mg | Freq: Once | INTRAMUSCULAR | Status: AC
Start: 2021-08-21 — End: 2021-08-21
  Administered 2021-08-21: 09:00:00 150 mg via INTRAMUSCULAR

## 2021-08-21 NOTE — Progress Notes (Signed)
Per orders of Dr. Ermalene Searing, injection of Depo-Provera given into LUQ of Glute by Erby Pian. Patient tolerated injection well.

## 2021-08-31 DIAGNOSIS — Z419 Encounter for procedure for purposes other than remedying health state, unspecified: Secondary | ICD-10-CM | POA: Diagnosis not present

## 2021-10-01 DIAGNOSIS — Z419 Encounter for procedure for purposes other than remedying health state, unspecified: Secondary | ICD-10-CM | POA: Diagnosis not present

## 2021-10-29 DIAGNOSIS — Z419 Encounter for procedure for purposes other than remedying health state, unspecified: Secondary | ICD-10-CM | POA: Diagnosis not present

## 2021-11-06 ENCOUNTER — Other Ambulatory Visit: Payer: Self-pay

## 2021-11-06 ENCOUNTER — Ambulatory Visit (INDEPENDENT_AMBULATORY_CARE_PROVIDER_SITE_OTHER): Payer: Medicaid Other

## 2021-11-06 DIAGNOSIS — Z3042 Encounter for surveillance of injectable contraceptive: Secondary | ICD-10-CM

## 2021-11-06 MED ORDER — MEDROXYPROGESTERONE ACETATE 150 MG/ML IM SUSP
150.0000 mg | Freq: Once | INTRAMUSCULAR | Status: AC
  Administered 2021-11-06: 09:00:00 150 mg via INTRAMUSCULAR

## 2021-11-06 NOTE — Progress Notes (Signed)
Per orders of Dr. Cody, injection of Depo-Provera given by Whittany Parish. Patient tolerated injection well.  

## 2021-11-29 DIAGNOSIS — Z419 Encounter for procedure for purposes other than remedying health state, unspecified: Secondary | ICD-10-CM | POA: Diagnosis not present

## 2021-12-29 DIAGNOSIS — Z419 Encounter for procedure for purposes other than remedying health state, unspecified: Secondary | ICD-10-CM | POA: Diagnosis not present

## 2022-01-29 DIAGNOSIS — Z419 Encounter for procedure for purposes other than remedying health state, unspecified: Secondary | ICD-10-CM | POA: Diagnosis not present

## 2022-02-11 ENCOUNTER — Ambulatory Visit (INDEPENDENT_AMBULATORY_CARE_PROVIDER_SITE_OTHER): Payer: Medicaid Other

## 2022-02-11 DIAGNOSIS — Z3042 Encounter for surveillance of injectable contraceptive: Secondary | ICD-10-CM | POA: Diagnosis not present

## 2022-02-11 MED ORDER — MEDROXYPROGESTERONE ACETATE 150 MG/ML IM SUSP
150.0000 mg | Freq: Once | INTRAMUSCULAR | Status: AC
  Administered 2022-02-11: 150 mg via INTRAMUSCULAR

## 2022-02-11 NOTE — Progress Notes (Signed)
Per orders of Dr. Patsy Lager, patient given Depo-Provera injection to left ventrogluteal, given by Wendie Simmer, CMA. Patient voiced no concerns nor showed any signs of distress during injection.

## 2022-02-28 DIAGNOSIS — Z419 Encounter for procedure for purposes other than remedying health state, unspecified: Secondary | ICD-10-CM | POA: Diagnosis not present

## 2022-03-29 IMAGING — MR MR HEAD W/O CM
12 of 13 series · 44 of 48 positions shown · non-contrast
Comparison: None.

CLINICAL DATA: Diffuse tingling, now resolved right facial droop

EXAM:
MRI HEAD WITHOUT CONTRAST
TECHNIQUE: Multiplanar, multiecho pulse sequences of the brain and surrounding
structures were obtained without intravenous contrast.

[Series 5: DWI · axial · 3.0mm · 0.88mm/px · z∈[-62,+74]mm · 7 of 100 slices shown (1 of 4)]
[im 1/100]
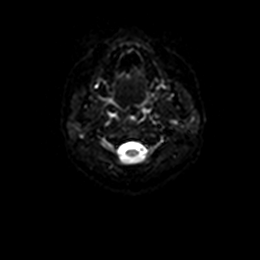
[im 17/100]
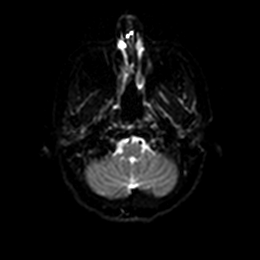
[im 34/100]
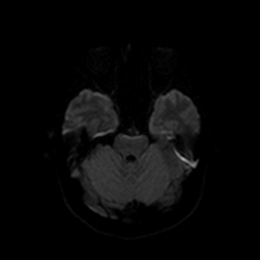
[im 50/100]
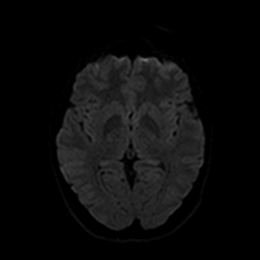
[im 67/100]
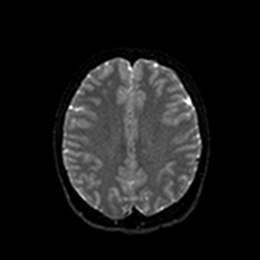
[im 83/100]
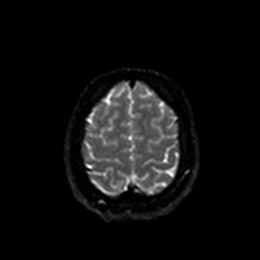
[im 100/100]
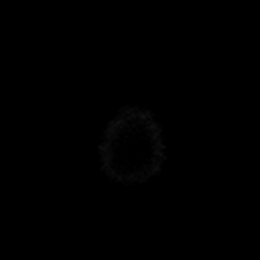

[Series 6: DWI · axial · 3.0mm · 0.88mm/px · z∈[-62,+74]mm · 4 of 50 slices shown (2 of 4)]
[im 1/50]
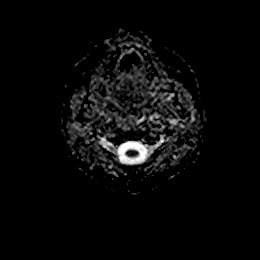
[im 17/50]
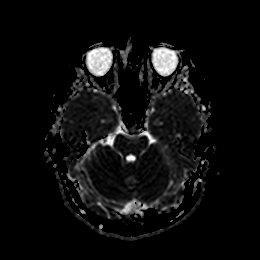
[im 33/50]
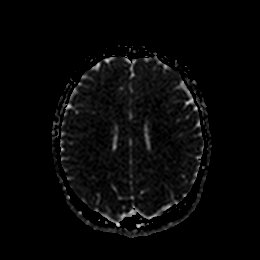
[im 50/50]
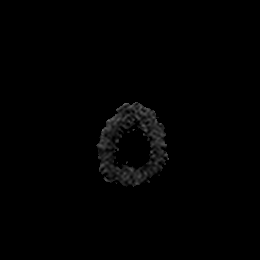

[Series 7: DWI · coronal · 4.0mm · 0.88mm/px · 6 of 72 slices shown (3 of 4)]
[im 1/72]
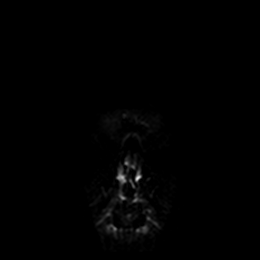
[im 15/72]
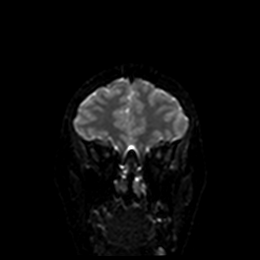
[im 29/72]
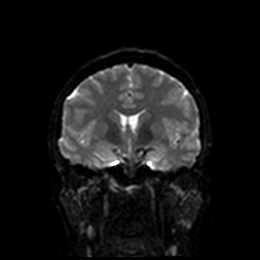
[im 43/72]
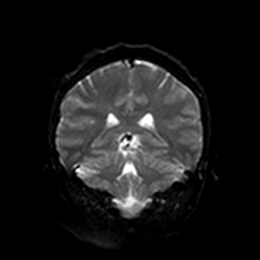
[im 57/72]
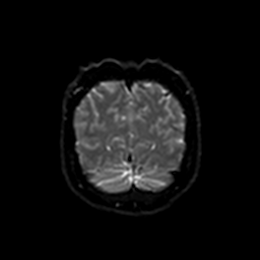
[im 72/72]
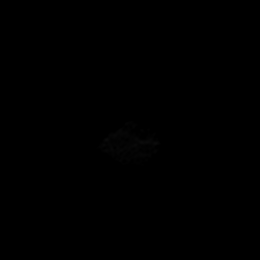

[Series 8: DWI · coronal · 4.0mm · 0.88mm/px · 3 of 36 slices shown (4 of 4)]
[im 1/36]
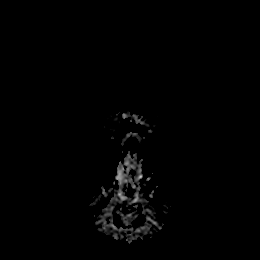
[im 18/36]
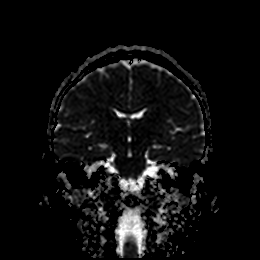
[im 36/36]
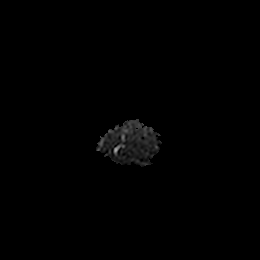

[Series 9: FLAIR · axial · 5.0mm · 0.45mm/px · z∈[-59,+75]mm · 2 of 25 slices shown]
[im 1/25]
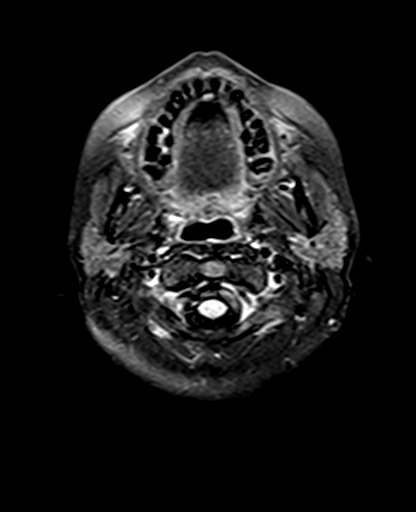
[im 25/25]
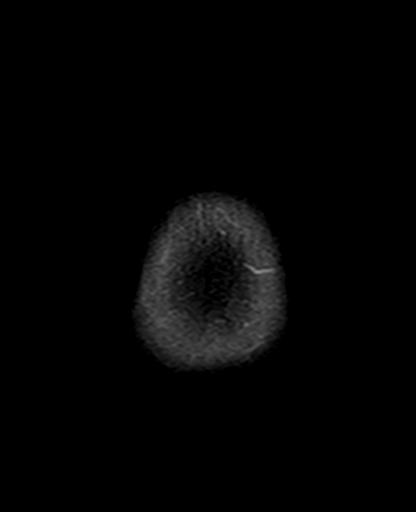

[Series 10: mag_images · axial · 3.0mm · 0.90mm/px · z∈[-63,+79]mm · 4 of 52 slices shown]
[im 1/52]
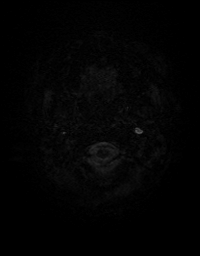
[im 18/52]
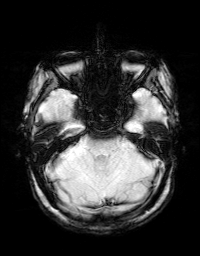
[im 35/52]
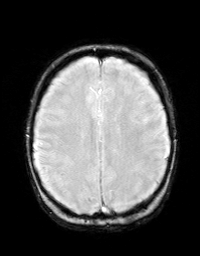
[im 52/52]
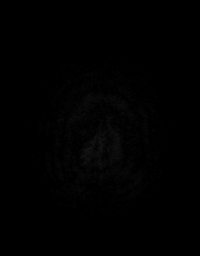

[Series 11: pha_images · axial · 3.0mm · 0.90mm/px · z∈[-63,+79]mm · 4 of 52 slices shown]
[im 1/52]
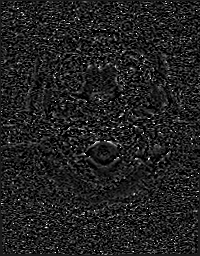
[im 18/52]
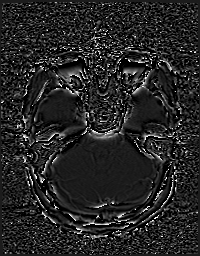
[im 35/52]
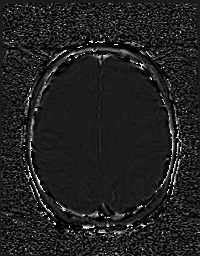
[im 52/52]
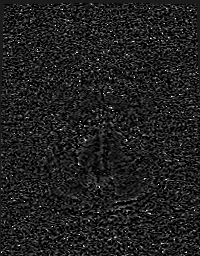

[Series 12: swi_images · axial · 3.0mm · 0.90mm/px · z∈[-63,+79]mm · 4 of 52 slices shown]
[im 1/52]
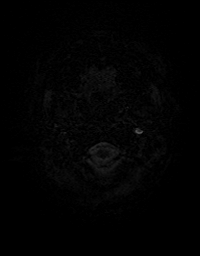
[im 18/52]
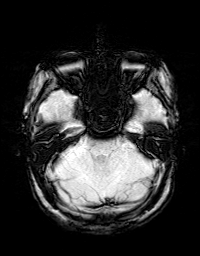
[im 35/52]
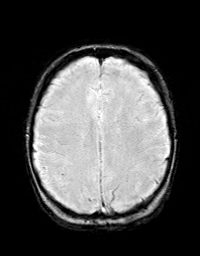
[im 52/52]
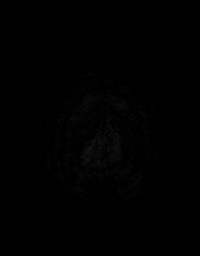

[Series 13: mip_images(sw) · axial · 24.0mm · 0.90mm/px · z∈[-53,+69]mm · 4 of 45 slices shown]
[im 1/45]
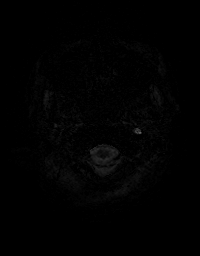
[im 15/45]
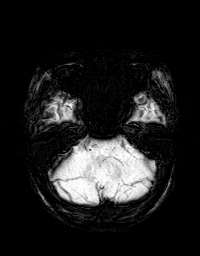
[im 30/45]
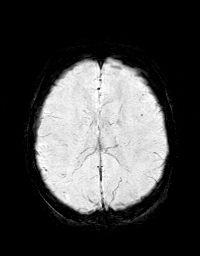
[im 45/45]
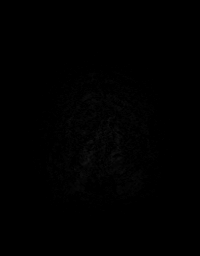

[Series 14: T1 · sagittal · 5.0mm · 0.75mm/px · 2 of 25 slices shown]
[im 1/25]
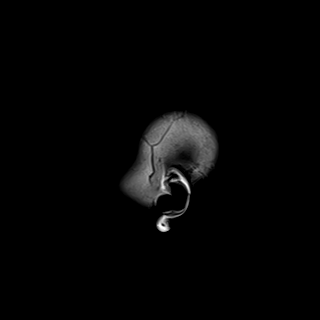
[im 25/25]
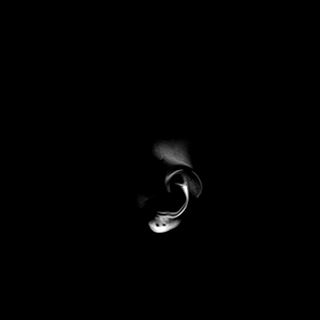

[Series 15: T2 · axial · 5.0mm · 0.72mm/px · z∈[-61,+73]mm · 2 of 25 slices shown (1 of 2)]
[im 1/25]
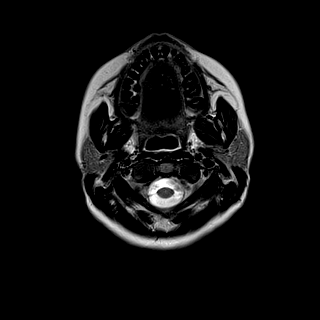
[im 25/25]
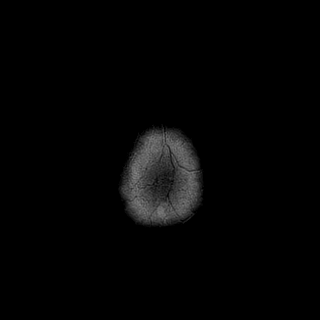

[Series 17: T2 · coronal · 5.0mm · 0.34mm/px · 2 of 29 slices shown (2 of 2)]
[im 1/29]
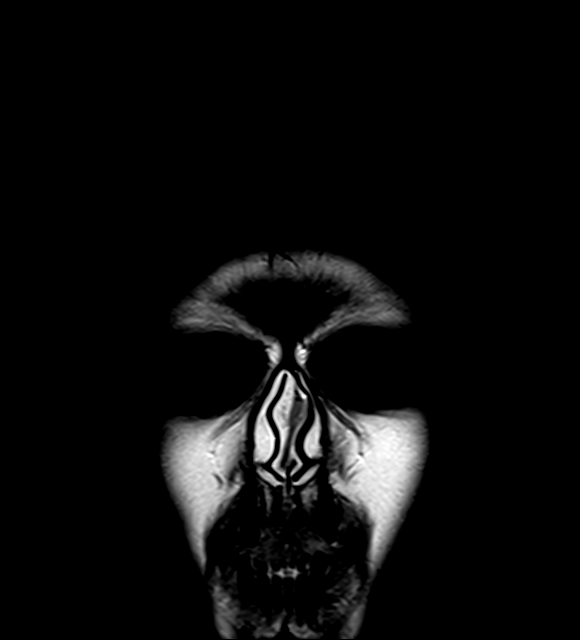
[im 29/29]
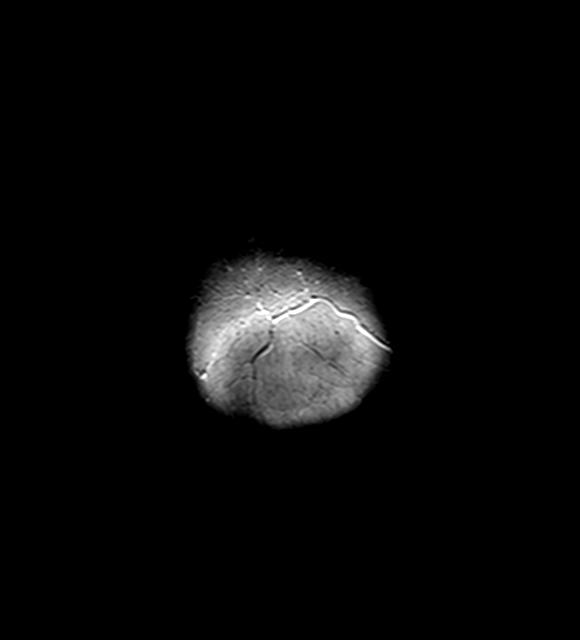

[44 of 48 positions shown; findings below may reference images not displayed]

FINDINGS: Brain: There is no acute infarction or intracranial hemorrhage.
There is no intracranial mass, mass effect, or edema. There is no
hydrocephalus or extra-axial fluid collection. Ventricles and sulci
are normal in size and configuration.

Vascular: Major vessel flow voids at the skull base are preserved.

Skull and upper cervical spine: Normal marrow signal is preserved.

Sinuses/Orbits: Trace paranasal sinus mucosal thickening. Orbits are
unremarkable.

Other: Sella is unremarkable.  Mastoid air cells are clear.
IMPRESSION: No evidence of recent infarction, hemorrhage, or mass.

## 2022-03-31 DIAGNOSIS — Z419 Encounter for procedure for purposes other than remedying health state, unspecified: Secondary | ICD-10-CM | POA: Diagnosis not present

## 2022-05-01 DIAGNOSIS — Z419 Encounter for procedure for purposes other than remedying health state, unspecified: Secondary | ICD-10-CM | POA: Diagnosis not present

## 2022-05-13 ENCOUNTER — Ambulatory Visit (INDEPENDENT_AMBULATORY_CARE_PROVIDER_SITE_OTHER): Payer: Medicaid Other

## 2022-05-13 DIAGNOSIS — Z3042 Encounter for surveillance of injectable contraceptive: Secondary | ICD-10-CM

## 2022-05-13 MED ORDER — MEDROXYPROGESTERONE ACETATE 150 MG/ML IM SUSP
150.0000 mg | Freq: Once | INTRAMUSCULAR | Status: AC
  Administered 2022-05-13: 150 mg via INTRAMUSCULAR

## 2022-05-13 NOTE — Progress Notes (Signed)
Sent to Dr Patsy Lager in Dr Daphine Deutscher absence this afternoon.  Per orders of Dr. Ermalene Searing, injection of Depo-Provera 150 mg/ml given by Eual Fines, CMA in LUOQ.  Patient tolerated injection well.  Next injection due between 07-29-22 and 08-12-22.

## 2022-05-14 ENCOUNTER — Ambulatory Visit: Payer: Medicaid Other

## 2022-05-15 ENCOUNTER — Encounter: Payer: Self-pay | Admitting: Family Medicine

## 2022-05-15 ENCOUNTER — Ambulatory Visit (INDEPENDENT_AMBULATORY_CARE_PROVIDER_SITE_OTHER): Payer: Medicaid Other | Admitting: Family Medicine

## 2022-05-15 VITALS — BP 110/70 | HR 79 | Temp 98.1°F | Ht 63.0 in | Wt 133.5 lb

## 2022-05-15 DIAGNOSIS — Z0000 Encounter for general adult medical examination without abnormal findings: Secondary | ICD-10-CM | POA: Diagnosis not present

## 2022-05-15 DIAGNOSIS — Z23 Encounter for immunization: Secondary | ICD-10-CM

## 2022-05-15 DIAGNOSIS — Z1159 Encounter for screening for other viral diseases: Secondary | ICD-10-CM

## 2022-05-15 DIAGNOSIS — Z13220 Encounter for screening for lipoid disorders: Secondary | ICD-10-CM

## 2022-05-15 LAB — COMPREHENSIVE METABOLIC PANEL
ALT: 17 U/L (ref 0–35)
AST: 17 U/L (ref 0–37)
Albumin: 4.4 g/dL (ref 3.5–5.2)
Alkaline Phosphatase: 113 U/L (ref 39–117)
BUN: 16 mg/dL (ref 6–23)
CO2: 26 mEq/L (ref 19–32)
Calcium: 9.4 mg/dL (ref 8.4–10.5)
Chloride: 107 mEq/L (ref 96–112)
Creatinine, Ser: 0.92 mg/dL (ref 0.40–1.20)
GFR: 85.1 mL/min (ref >60.00)
Glucose, Bld: 92 mg/dL (ref 70–99)
Potassium: 4.2 mEq/L (ref 3.5–5.1)
Sodium: 140 mEq/L (ref 135–145)
Total Bilirubin: 0.3 mg/dL (ref 0.2–1.2)
Total Protein: 7.2 g/dL (ref 6.0–8.3)

## 2022-05-15 LAB — LIPID PANEL
Cholesterol: 170 mg/dL (ref 0–200)
HDL: 52.1 mg/dL (ref >39.00)
LDL Cholesterol: 109 mg/dL — ABNORMAL HIGH (ref 0–99)
NonHDL: 118.18
Total CHOL/HDL Ratio: 3
Triglycerides: 48 mg/dL (ref 0.0–149.0)
VLDL: 9.6 mg/dL (ref 0.0–40.0)

## 2022-05-15 NOTE — Progress Notes (Signed)
Patient ID: Heidi Orr, female    DOB: [birth year], 26 y.o.   MRN: [id number]  This visit was conducted in person.  BP 110/70   Pulse 79   Temp 98.1 F (36.7 C) (Oral)   Ht 5\' 3"  (1.6 m)   Wt 133 lb 8 oz (60.6 kg)   SpO2 98%   BMI 23.65 kg/m    CC:  Chief Complaint  Patient presents with   Annual Exam    Subjective:   HPI: Heidi Orr is a 26 y.o. female presenting on 05/15/2022 for Annual Exam    Due for routine labs.   She is walking and playing with kids some.  Diet: avoiding fried foods. Some fruits and veggies. 40-64 oz water daily  Cutting sodas back.  Body mass index is 23.65 kg/m. Wt Readings from Last 3 Encounters:  05/15/22 133 lb 8 oz (60.6 kg)  02/25/21 117 lb 8 oz (53.3 kg)  02/07/21 122 lb 5.7 oz (55.5 kg)        Relevant past medical, surgical, family and social history reviewed and updated as indicated. Interim medical history since our last visit reviewed. Allergies and medications reviewed and updated. Outpatient Medications Prior to Visit  Medication Sig Dispense Refill   medroxyPROGESTERone (DEPO-PROVERA) 150 MG/ML injection Inject 150 mg into the muscle every 3 (three) months.     No facility-administered medications prior to visit.     Per HPI unless specifically indicated in ROS section below Review of Systems  Constitutional:  Negative for fatigue and fever.  HENT:  Negative for congestion.   Eyes:  Negative for pain.  Respiratory:  Negative for cough and shortness of breath.   Cardiovascular:  Negative for chest pain, palpitations and leg swelling.  Gastrointestinal:  Negative for abdominal pain.  Genitourinary:  Negative for dysuria and vaginal bleeding.  Musculoskeletal:  Negative for back pain.  Neurological:  Negative for syncope, light-headedness and headaches.  Psychiatric/Behavioral:  Negative for dysphoric mood.    Objective:  BP 110/70   Pulse 79   Temp 98.1 F (36.7 C) (Oral)   Ht 5\' 3"  (1.6 m)   Wt  133 lb 8 oz (60.6 kg)   SpO2 98%   BMI 23.65 kg/m   Wt Readings from Last 3 Encounters:  05/15/22 133 lb 8 oz (60.6 kg)  02/25/21 117 lb 8 oz (53.3 kg)  02/07/21 122 lb 5.7 oz (55.5 kg)      Physical Exam Vitals and nursing note reviewed.  Constitutional:      General: She is not in acute distress.    Appearance: Normal appearance. She is well-developed. She is not ill-appearing or toxic-appearing.  HENT:     Head: Normocephalic.     Right Ear: Hearing, tympanic membrane, ear canal and external ear normal.     Left Ear: Hearing, tympanic membrane, ear canal and external ear normal.     Nose: Nose normal.  Eyes:     General: Lids are normal. Lids are everted, no foreign bodies appreciated.     Conjunctiva/sclera: Conjunctivae normal.     Pupils: Pupils are equal, round, and reactive to light.  Neck:     Thyroid: No thyroid mass or thyromegaly.     Vascular: No carotid bruit.     Trachea: Trachea normal.  Cardiovascular:     Rate and Rhythm: Normal rate and regular rhythm.     Heart sounds: Normal heart sounds, S1 normal and S2 normal. No murmur heard.  No gallop.  Pulmonary:     Effort: Pulmonary effort is normal. No respiratory distress.     Breath sounds: Normal breath sounds. No wheezing, rhonchi or rales.  Abdominal:     General: Bowel sounds are normal. There is no distension or abdominal bruit.     Palpations: Abdomen is soft. There is no fluid wave or mass.     Tenderness: There is no abdominal tenderness. There is no guarding or rebound.     Hernia: No hernia is present.  Musculoskeletal:     Cervical back: Normal range of motion and neck supple.  Lymphadenopathy:     Cervical: No cervical adenopathy.  Skin:    General: Skin is warm and dry.     Findings: No rash.  Neurological:     Mental Status: She is alert.     Cranial Nerves: No cranial nerve deficit.     Sensory: No sensory deficit.  Psychiatric:        Mood and Affect: Mood is not anxious or  depressed.        Speech: Speech normal.        Behavior: Behavior normal. Behavior is cooperative.        Judgment: Judgment normal.       Results for orders placed or performed in visit on 03/11/21  POCT urine pregnancy  Result Value Ref Range   Preg Test, Ur Negative Negative     COVID 19 screen:  No recent travel or known exposure to COVID19 The patient denies respiratory symptoms of COVID 19 at this time. The importance of social distancing was discussed today.   Assessment and Plan   The patient's preventative maintenance and recommended screening tests for an annual wellness exam were reviewed in full today. Brought up to date unless services declined.  Counselled on the importance of diet, exercise, and its role in overall health and mortality. The patient's FH and SH was reviewed, including their home life, tobacco status, and drug and alcohol status.    Hep C testing due. Pap: 2021 ASCUS , repeat 3 years per GYN. Tdap uptodate,  flu shot given. No  early family history colon cancer or breast cancer.  Problem List Items Addressed This Visit   None Visit Diagnoses     Routine general medical examination at a health care facility    -  Primary   Need for influenza vaccination       Relevant Orders   Flu Vaccine QUAD 6+ mos PF IM (Fluarix Quad PF) (Completed)   Need for hepatitis C screening test       Relevant Orders   Hepatitis C antibody   Screening cholesterol level       Relevant Orders   Lipid panel   Comprehensive metabolic panel       Kerby Nora, MD

## 2022-05-15 NOTE — Patient Instructions (Signed)
Please stop at the lab to have labs drawn.  

## 2022-05-19 LAB — HEPATITIS C ANTIBODY: Hepatitis C Ab: NONREACTIVE

## 2022-05-31 DIAGNOSIS — Z419 Encounter for procedure for purposes other than remedying health state, unspecified: Secondary | ICD-10-CM | POA: Diagnosis not present

## 2022-07-01 DIAGNOSIS — Z419 Encounter for procedure for purposes other than remedying health state, unspecified: Secondary | ICD-10-CM | POA: Diagnosis not present

## 2022-07-31 DIAGNOSIS — Z419 Encounter for procedure for purposes other than remedying health state, unspecified: Secondary | ICD-10-CM | POA: Diagnosis not present

## 2022-08-06 ENCOUNTER — Ambulatory Visit (INDEPENDENT_AMBULATORY_CARE_PROVIDER_SITE_OTHER): Payer: Medicaid Other | Admitting: *Deleted

## 2022-08-06 ENCOUNTER — Ambulatory Visit: Payer: Medicaid Other

## 2022-08-06 DIAGNOSIS — Z3042 Encounter for surveillance of injectable contraceptive: Secondary | ICD-10-CM

## 2022-08-06 MED ORDER — MEDROXYPROGESTERONE ACETATE 150 MG/ML IM SUSP
150.0000 mg | Freq: Once | INTRAMUSCULAR | Status: AC
  Administered 2022-08-06: 150 mg via INTRAMUSCULAR

## 2022-08-06 NOTE — Progress Notes (Signed)
Per orders of Dr. Ermalene Searing, injection of Depo-provera given by Shon Millet. Patient tolerated injection well.

## 2022-08-31 DIAGNOSIS — Z419 Encounter for procedure for purposes other than remedying health state, unspecified: Secondary | ICD-10-CM | POA: Diagnosis not present

## 2022-10-01 DIAGNOSIS — Z419 Encounter for procedure for purposes other than remedying health state, unspecified: Secondary | ICD-10-CM | POA: Diagnosis not present

## 2022-10-27 ENCOUNTER — Ambulatory Visit (INDEPENDENT_AMBULATORY_CARE_PROVIDER_SITE_OTHER): Payer: Medicaid Other

## 2022-10-27 DIAGNOSIS — Z3042 Encounter for surveillance of injectable contraceptive: Secondary | ICD-10-CM | POA: Diagnosis not present

## 2022-10-27 MED ORDER — MEDROXYPROGESTERONE ACETATE 150 MG/ML IM SUSP
150.0000 mg | INTRAMUSCULAR | Status: AC
  Administered 2022-10-27: 150 mg via INTRAMUSCULAR

## 2022-10-27 NOTE — Progress Notes (Signed)
Per orders of Dr. Eliezer Lofts, injection of Depo provera 150 mg  given by Ozzie Hoyle. Patient tolerated injection well.

## 2022-10-30 DIAGNOSIS — Z419 Encounter for procedure for purposes other than remedying health state, unspecified: Secondary | ICD-10-CM | POA: Diagnosis not present

## 2022-11-30 DIAGNOSIS — Z419 Encounter for procedure for purposes other than remedying health state, unspecified: Secondary | ICD-10-CM | POA: Diagnosis not present

## 2022-12-30 DIAGNOSIS — Z419 Encounter for procedure for purposes other than remedying health state, unspecified: Secondary | ICD-10-CM | POA: Diagnosis not present

## 2023-01-13 ENCOUNTER — Ambulatory Visit (INDEPENDENT_AMBULATORY_CARE_PROVIDER_SITE_OTHER): Payer: Medicaid Other

## 2023-01-13 DIAGNOSIS — Z3042 Encounter for surveillance of injectable contraceptive: Secondary | ICD-10-CM | POA: Diagnosis not present

## 2023-01-13 MED ORDER — MEDROXYPROGESTERONE ACETATE 150 MG/ML IM SUSY
150.0000 mg | PREFILLED_SYRINGE | Freq: Once | INTRAMUSCULAR | Status: AC
  Administered 2023-01-13: 150 mg via INTRAMUSCULAR

## 2023-01-13 NOTE — Progress Notes (Signed)
Per orders of Dr. Kerby Nora, injection of Depo  given by Donnamarie Poag in right Upper outer quadrant  Patient tolerated injection well. Next depo appointment made for 04/06/23.

## 2023-01-30 DIAGNOSIS — Z419 Encounter for procedure for purposes other than remedying health state, unspecified: Secondary | ICD-10-CM | POA: Diagnosis not present

## 2023-03-01 DIAGNOSIS — Z419 Encounter for procedure for purposes other than remedying health state, unspecified: Secondary | ICD-10-CM | POA: Diagnosis not present

## 2023-04-01 DIAGNOSIS — Z419 Encounter for procedure for purposes other than remedying health state, unspecified: Secondary | ICD-10-CM | POA: Diagnosis not present

## 2023-04-06 ENCOUNTER — Ambulatory Visit: Payer: Medicaid Other

## 2023-04-06 DIAGNOSIS — Z3042 Encounter for surveillance of injectable contraceptive: Secondary | ICD-10-CM | POA: Diagnosis not present

## 2023-04-06 MED ORDER — MEDROXYPROGESTERONE ACETATE 150 MG/ML IM SUSY
150.0000 mg | PREFILLED_SYRINGE | Freq: Once | INTRAMUSCULAR | Status: AC
  Administered 2023-04-06: 150 mg via INTRAMUSCULAR

## 2023-04-06 NOTE — Progress Notes (Signed)
Per orders of Dr. Kerby Nora, injection of Depo-Provera  given by Benedict Needy in left deltoid. Patient tolerated injection well. Patient will make appointment between 10/22/24and 07/06/23.

## 2023-05-02 DIAGNOSIS — Z419 Encounter for procedure for purposes other than remedying health state, unspecified: Secondary | ICD-10-CM | POA: Diagnosis not present

## 2023-06-01 DIAGNOSIS — Z419 Encounter for procedure for purposes other than remedying health state, unspecified: Secondary | ICD-10-CM | POA: Diagnosis not present

## 2023-06-22 ENCOUNTER — Ambulatory Visit (INDEPENDENT_AMBULATORY_CARE_PROVIDER_SITE_OTHER): Payer: Medicaid Other

## 2023-06-22 DIAGNOSIS — Z3042 Encounter for surveillance of injectable contraceptive: Secondary | ICD-10-CM | POA: Diagnosis not present

## 2023-06-22 MED ORDER — MEDROXYPROGESTERONE ACETATE 150 MG/ML IM SUSP
150.0000 mg | INTRAMUSCULAR | Status: AC
  Administered 2023-06-22: 150 mg via INTRAMUSCULAR

## 2023-06-22 NOTE — Progress Notes (Signed)
Per orders of Dr. Ermalene Searing, patient given Depo-Provera injection to right deltoid, given by Wendie Simmer, CMA. Patient voiced no concerns nor showed any signs of distress during injection.  Patient aware she was 2 days early for injection and Dr. Ermalene Searing was notified and okay with patient getting depo done today. Patient was given dosing schedule for next injection.

## 2023-07-02 DIAGNOSIS — Z419 Encounter for procedure for purposes other than remedying health state, unspecified: Secondary | ICD-10-CM | POA: Diagnosis not present

## 2023-08-01 DIAGNOSIS — Z419 Encounter for procedure for purposes other than remedying health state, unspecified: Secondary | ICD-10-CM | POA: Diagnosis not present

## 2023-09-01 DIAGNOSIS — Z419 Encounter for procedure for purposes other than remedying health state, unspecified: Secondary | ICD-10-CM | POA: Diagnosis not present

## 2023-09-08 ENCOUNTER — Ambulatory Visit: Payer: Medicaid Other

## 2023-09-08 DIAGNOSIS — Z3042 Encounter for surveillance of injectable contraceptive: Secondary | ICD-10-CM

## 2023-09-08 MED ORDER — MEDROXYPROGESTERONE ACETATE 150 MG/ML IM SUSP
150.0000 mg | INTRAMUSCULAR | Status: AC
  Administered 2023-09-08: 150 mg via INTRAMUSCULAR

## 2023-09-08 NOTE — Progress Notes (Signed)
 Per orders of Dr. Kerby Nora, injection of medroxyprogesterone acetate 150 mg given by Lewanda Rife in LUOQ. Patient tolerated injection well. Patient will make appointment for 3 month offered pt inj in thigh but pt said prefers muscle in buttock.Marland Kitchen

## 2023-09-15 ENCOUNTER — Emergency Department (HOSPITAL_COMMUNITY)
Admission: EM | Admit: 2023-09-15 | Discharge: 2023-09-16 | Disposition: A | Discharge status: Home / Self Care | Payer: Medicaid Other | Attending: Emergency Medicine | Admitting: Emergency Medicine

## 2023-09-15 ENCOUNTER — Emergency Department (HOSPITAL_COMMUNITY): Payer: Medicaid Other

## 2023-09-15 ENCOUNTER — Encounter (HOSPITAL_COMMUNITY): Payer: Self-pay

## 2023-09-15 ENCOUNTER — Other Ambulatory Visit: Payer: Self-pay

## 2023-09-15 DIAGNOSIS — N134 Hydroureter: Secondary | ICD-10-CM | POA: Diagnosis not present

## 2023-09-15 DIAGNOSIS — N200 Calculus of kidney: Secondary | ICD-10-CM | POA: Diagnosis not present

## 2023-09-15 DIAGNOSIS — N132 Hydronephrosis with renal and ureteral calculous obstruction: Secondary | ICD-10-CM | POA: Diagnosis not present

## 2023-09-15 DIAGNOSIS — R109 Unspecified abdominal pain: Secondary | ICD-10-CM | POA: Diagnosis not present

## 2023-09-15 DIAGNOSIS — D72829 Elevated white blood cell count, unspecified: Secondary | ICD-10-CM | POA: Diagnosis not present

## 2023-09-15 DIAGNOSIS — K429 Umbilical hernia without obstruction or gangrene: Secondary | ICD-10-CM | POA: Diagnosis not present

## 2023-09-15 LAB — URINALYSIS, ROUTINE W REFLEX MICROSCOPIC
Bacteria, UA: NONE SEEN
Bilirubin Urine: NEGATIVE
Glucose, UA: 150 mg/dL — AB
Ketones, ur: 5 mg/dL — AB
Nitrite: NEGATIVE
Protein, ur: 100 mg/dL — AB
RBC / HPF: >50 RBC/hpf (ref 0–5)
Specific Gravity, Urine: 1.032 — ABNORMAL HIGH (ref 1.005–1.030)
pH: 5 (ref 5.0–8.0)

## 2023-09-15 LAB — CBC WITH DIFFERENTIAL/PLATELET
Abs Immature Granulocytes: 0.05 10*3/uL (ref 0.00–0.07)
Basophils Absolute: 0.1 10*3/uL (ref 0.0–0.1)
Basophils Relative: 0 %
Eosinophils Absolute: 0 10*3/uL (ref 0.0–0.5)
Eosinophils Relative: 0 %
HCT: 39.4 % (ref 36.0–46.0)
Hemoglobin: 12.8 g/dL (ref 12.0–15.0)
Immature Granulocytes: 0 %
Lymphocytes Relative: 9 %
Lymphs Abs: 1.3 10*3/uL (ref 0.7–4.0)
MCH: 28.4 pg (ref 26.0–34.0)
MCHC: 32.5 g/dL (ref 30.0–36.0)
MCV: 87.6 fL (ref 80.0–100.0)
Monocytes Absolute: 0.6 10*3/uL (ref 0.1–1.0)
Monocytes Relative: 4 %
Neutro Abs: 12.6 10*3/uL — ABNORMAL HIGH (ref 1.7–7.7)
Neutrophils Relative %: 87 %
Platelets: 191 10*3/uL (ref 150–400)
RBC: 4.5 MIL/uL (ref 3.87–5.11)
RDW: 12.9 % (ref 11.5–15.5)
WBC: 14.6 10*3/uL — ABNORMAL HIGH (ref 4.0–10.5)
nRBC: 0 % (ref 0.0–0.2)

## 2023-09-15 LAB — PREGNANCY, URINE: Preg Test, Ur: NEGATIVE

## 2023-09-15 NOTE — ED Notes (Signed)
 Patient transported to CT

## 2023-09-15 NOTE — ED Triage Notes (Signed)
 Arrives [facility] from home with sudden right flank manifesting ~3 hours ago that radiates to groin.   Subjective blood in urine with urgency.   Paramedics administer:  700cc NS  100mcg Fentanyl   4mg  zofran 

## 2023-09-16 LAB — I-STAT CHEM 8, ED
BUN: 16 mg/dL (ref 6–20)
Calcium, Ion: 1.16 mmol/L (ref 1.15–1.40)
Chloride: 113 mmol/L — ABNORMAL HIGH (ref 98–111)
Creatinine, Ser: 1.2 mg/dL — ABNORMAL HIGH (ref 0.44–1.00)
Glucose, Bld: 138 mg/dL — ABNORMAL HIGH (ref 70–99)
HCT: 39 % (ref 36.0–46.0)
Hemoglobin: 13.3 g/dL (ref 12.0–15.0)
Potassium: 4.6 mmol/L (ref 3.5–5.1)
Sodium: 143 mmol/L (ref 135–145)
TCO2: 20 mmol/L — ABNORMAL LOW (ref 22–32)

## 2023-09-16 MED ORDER — SODIUM CHLORIDE 0.9 % IV SOLN
1.0000 g | Freq: Once | INTRAVENOUS | Status: AC
  Administered 2023-09-16: 1 g via INTRAVENOUS
  Filled 2023-09-16: qty 10

## 2023-09-16 MED ORDER — CEPHALEXIN 500 MG PO CAPS: 500.0000 mg | ORAL_CAPSULE | Freq: Four times a day (QID) | ORAL | 0 refills | Status: AC

## 2023-09-16 MED ORDER — OXYCODONE-ACETAMINOPHEN 5-325 MG PO TABS
1.0000 | ORAL_TABLET | Freq: Once | ORAL | Status: AC
Start: 2023-09-16 — End: 2023-09-16
  Administered 2023-09-16: 1 via ORAL
  Filled 2023-09-16: qty 1

## 2023-09-16 MED ORDER — OXYCODONE HCL 5 MG PO TABS: 5.0000 mg | ORAL_TABLET | ORAL | 0 refills | Status: DC | PRN

## 2023-09-16 MED ORDER — IBUPROFEN 600 MG PO TABS: 600.0000 mg | ORAL_TABLET | Freq: Four times a day (QID) | ORAL | 0 refills | Status: DC

## 2023-09-16 MED ORDER — ONDANSETRON HCL 4 MG PO TABS: 4.0000 mg | ORAL_TABLET | Freq: Three times a day (TID) | ORAL | 0 refills | Status: DC | PRN

## 2023-09-16 MED ORDER — KETOROLAC TROMETHAMINE 15 MG/ML IJ SOLN
15.0000 mg | Freq: Once | INTRAMUSCULAR | Status: AC
  Administered 2023-09-16: 15 mg via INTRAVENOUS
  Filled 2023-09-16: qty 1

## 2023-09-16 MED ORDER — TAMSULOSIN HCL 0.4 MG PO CAPS: 0.4000 mg | ORAL_CAPSULE | Freq: Every day | ORAL | 0 refills | Status: DC

## 2023-09-16 MED ORDER — ONDANSETRON HCL 4 MG/2ML IJ SOLN
4.0000 mg | Freq: Once | INTRAMUSCULAR | Status: AC
  Administered 2023-09-16: 4 mg via INTRAVENOUS
  Filled 2023-09-16: qty 2

## 2023-09-16 NOTE — Discharge Instructions (Addendum)
You were diagnosed today with a kidney stone.  I have prescribed ibuprofen to be taken every 6 hours, or oxycodone for breakthrough pain, Zofran to be taken as needed for nausea, and Flomax to be taken daily. Antibiotics have also been prescribed.  I have also provided contact information for urology for follow-up.  Return to the ED immediately if you develop fever, uncontrolled pain or vomiting, or other concerns.

## 2023-09-16 NOTE — ED Notes (Signed)
Pts mother cursing at [employer] because pt is in pain. Was told earlier that no stronger meds could be given due to no rooms being available to monitor pt. Told both pt and mother this information, pt vitals were reassessed and writer told mother I would let the nurse know that she is in pain.

## 2023-09-16 NOTE — ED Provider Notes (Signed)
[place] EMERGENCY DEPARTMENT AT [facility] Provider Note   CSN: [id number] Arrival date & time: 09/15/23  2154     History  Chief Complaint  Patient presents with   Flank Pain    Heidi Orr is a 28 y.o. female.  Patient presents to the emergency department via EMS complaining of right-sided flank pain which began at approximately 7 PM.  Pain radiates around the right side of her abdomen.  She also reports possible hematuria with increased urinary urgency.  She denies nausea, vomiting, fever, shortness of breath.  EMS administered 700 cc of normal saline, 100 mcg of fentanyl, 4 mg of Zofran during transport.  Past medical history significant for syncope, IBS   Flank Pain       Home Medications Prior to Admission medications   Medication Sig Start Date End Date Taking? Authorizing Provider  cephALEXin (KEFLEX) 500 MG capsule Take 1 capsule (500 mg total) by mouth 4 (four) times daily for 10 days. 09/16/23 09/26/23 Yes Darrick Grinder, PA-C  ibuprofen (ADVIL) 600 MG tablet Take 1 tablet (600 mg total) by mouth every 6 (six) hours. 09/16/23  Yes Barrie Dunker B, PA-C  ondansetron (ZOFRAN) 4 MG tablet Take 1 tablet (4 mg total) by mouth every 8 (eight) hours as needed for nausea or vomiting. 09/16/23  Yes Darrick Grinder, PA-C  oxyCODONE (ROXICODONE) 5 MG immediate release tablet Take 1 tablet (5 mg total) by mouth every 4 (four) hours as needed for severe pain (pain score 7-10). 09/16/23  Yes Darrick Grinder, PA-C  tamsulosin (FLOMAX) 0.4 MG CAPS capsule Take 1 capsule (0.4 mg total) by mouth daily. 09/16/23  Yes Darrick Grinder, PA-C  medroxyPROGESTERone (DEPO-PROVERA) 150 MG/ML injection Inject 150 mg into the muscle every 3 (three) months.    [provider]      Allergies    Patient has no known allergies.    Review of Systems   Review of Systems  Genitourinary:  Positive for flank pain.    Physical Exam Updated Vital Signs BP 129/88    Pulse 78   Temp 98.2 F (36.8 C) (Oral)   Resp 16   Ht 5\' 3"  (1.6 m)   Wt 59 kg   SpO2 100%   BMI 23.03 kg/m  Physical Exam Vitals and nursing note reviewed.  Constitutional:      General: She is not in acute distress.    Appearance: She is well-developed.  HENT:     Head: Normocephalic and atraumatic.  Eyes:     Conjunctiva/sclera: Conjunctivae normal.  Cardiovascular:     Rate and Rhythm: Normal rate and regular rhythm.  Pulmonary:     Effort: Pulmonary effort is normal. No respiratory distress.     Breath sounds: Normal breath sounds.  Abdominal:     Palpations: Abdomen is soft.     Tenderness: There is no abdominal tenderness. There is right CVA tenderness.  Musculoskeletal:        General: No swelling.     Cervical back: Neck supple.  Skin:    General: Skin is warm and dry.     Capillary Refill: Capillary refill takes less than 2 seconds.  Neurological:     Mental Status: She is alert.  Psychiatric:        Mood and Affect: Mood normal.     ED Results / Procedures / Treatments   Labs (all labs ordered are listed, but only abnormal results are displayed) Labs Reviewed  CBC WITH DIFFERENTIAL/PLATELET - Abnormal; Notable for the following components:      Result Value   WBC 14.6 (*)    Neutro Abs 12.6 (*)    All other components within normal limits  URINALYSIS, ROUTINE W REFLEX MICROSCOPIC - Abnormal; Notable for the following components:   APPearance CLOUDY (*)    Specific Gravity, Urine 1.032 (*)    Glucose, UA 150 (*)    Hgb urine dipstick LARGE (*)    Ketones, ur 5 (*)    Protein, ur 100 (*)    Leukocytes,Ua MODERATE (*)    All other components within normal limits  I-STAT CHEM 8, ED - Abnormal; Notable for the following components:   Chloride 113 (*)    Creatinine, Ser 1.20 (*)    Glucose, Bld 138 (*)    TCO2 20 (*)    All other components within normal limits  URINE CULTURE  PREGNANCY, URINE  COMPREHENSIVE METABOLIC PANEL     EKG None  Radiology CT Renal Stone Study Result Date: 09/15/2023 CLINICAL DATA:  Right-sided flank pain EXAM: CT ABDOMEN AND PELVIS WITHOUT CONTRAST TECHNIQUE: Multidetector CT imaging of the abdomen and pelvis was performed following the standard protocol without IV contrast. RADIATION DOSE REDUCTION: This exam was performed according to the departmental dose-optimization program which includes automated exposure control, adjustment of the mA and/or kV according to patient size and/or use of iterative reconstruction technique. COMPARISON:  None Available. FINDINGS: Lower chest: No acute abnormality. Hepatobiliary: No focal liver abnormality is seen. No gallstones, gallbladder wall thickening, or biliary dilatation. Pancreas: Unremarkable. No pancreatic ductal dilatation or surrounding inflammatory changes. Spleen: Normal in size without focal abnormality. Adrenals/Urinary Tract: Adrenal glands are normal. Moderate right hydronephrosis and hydroureter, secondary to a 2 mm stone at the right UVJ. Bladder is unremarkable Stomach/Bowel: Stomach is within normal limits. Appendix appears normal. No evidence of bowel wall thickening, distention, or inflammatory changes. Vascular/Lymphatic: No significant vascular findings are present. No enlarged abdominal or pelvic lymph nodes. Reproductive: Uterus and bilateral adnexa are unremarkable. Other: Negative for pelvic effusion or free air. Small fat containing umbilical hernia Musculoskeletal: No acute osseous abnormality IMPRESSION: Moderate right hydronephrosis and hydroureter, secondary to a 2 mm stone at the right UVJ. Electronically Signed   By: Jasmine Pang M.D.   On: 09/15/2023 23:59    Procedures Procedures    Medications Ordered in ED Medications  oxyCODONE-acetaminophen (PERCOCET/ROXICET) 5-325 MG per tablet 1 tablet (has no administration in time range)  cefTRIAXone (ROCEPHIN) 1 g in sodium chloride 0.9 % 100 mL IVPB (has no administration in  time range)  ketorolac (TORADOL) 15 MG/ML injection 15 mg (15 mg Intravenous Given 09/16/23 0344)  ondansetron (ZOFRAN) injection 4 mg (4 mg Intravenous Given 09/16/23 0343)    ED Course/ Medical Decision Making/ A&P                                 Medical Decision Making Amount and/or Complexity of Data Reviewed Labs: ordered. Radiology: ordered.  Risk Prescription drug management.   This patient presents to the ED for concern of flank pain, this involves an extensive number of treatment options, and is a complaint that carries with it a high risk of complications and morbidity.  The differential diagnosis includes nephrolithiasis, hydronephrosis, pyelonephritis, musculoskeletal pain, others   Co morbidities that complicate the patient evaluation  IBS   Additional history obtained:  Additional history obtained from EMS,  patient's mother External records from outside source obtained and reviewed including chart review   Lab Tests:  I Ordered, and personally interpreted labs.  The pertinent results include: Mild leukocytosis with a white count of 14,600; UA with large hemoglobin, ketones, protein, moderate leukocytes, greater than 50 RBC, 21-50 WBC, no bacteria; creatinine 1.2   Imaging Studies ordered:  I ordered imaging studies including CT renal stone study I independently visualized and interpreted imaging which showed  Moderate right hydronephrosis and hydroureter, secondary to a 2 mm  stone at the right UVJ.   I agree with the radiologist interpretation   Problem List / ED Course / Critical interventions / Medication management   I ordered medication including Toradol and oxycodone for pain, Zofran for nausea, Rocephin for antibiotic coverage Reevaluation of the patient after these medicines showed that the patient improved I have reviewed the patients home medicines and have made adjustments as needed   Social Determinants of Health:  Social determinants  of health were not a significant factor impacting this visit   Test / Admission - Considered:  Patient with a 2 mm stone, likely amenable to passing at home.  Pain is under control at this time.  Patient has passed a p.o. challenge.  She is afebrile. Some concern for infected urolithiasis with UA and leukocytosis.  Patient treated with 1 dose of Rocephin here.  Will send home with prescription for antibiotics, scheduled ibuprofen, Roxicodone, Zofran, and Flomax.  Will provide follow-up information for urology.  Patient given return precautions including intractable pain, fevers, intractable nausea/vomiting, inability to urinate.         Final Clinical Impression(s) / ED Diagnoses Final diagnoses:  Kidney stone    Rx / DC Orders ED Discharge Orders          Ordered    cephALEXin (KEFLEX) 500 MG capsule  4 times daily        09/16/23 0426    ibuprofen (ADVIL) 600 MG tablet  Every 6 hours        09/16/23 0426    oxyCODONE (ROXICODONE) 5 MG immediate release tablet  Every 4 hours PRN        09/16/23 0426    tamsulosin (FLOMAX) 0.4 MG CAPS capsule  Daily        09/16/23 0426    ondansetron (ZOFRAN) 4 MG tablet  Every 8 hours PRN        09/16/23 0426              Darrick Grinder, PA-C 09/16/23 0427    Nira Conn, MD 09/16/23 [phone]

## 2023-09-16 NOTE — ED Notes (Signed)
Discharge instructions reviewed with patient and mother.   Newly prescribed medications discussed. Pharmacy verified.   Opportunity for questions and concerns provided.   Alert, oriented and ambulatory.   Declines discharge vital signs. Says she's ready to go.

## 2023-09-17 LAB — URINE CULTURE

## 2023-10-02 DIAGNOSIS — Z419 Encounter for procedure for purposes other than remedying health state, unspecified: Secondary | ICD-10-CM | POA: Diagnosis not present

## 2023-10-30 DIAGNOSIS — Z419 Encounter for procedure for purposes other than remedying health state, unspecified: Secondary | ICD-10-CM | POA: Diagnosis not present

## 2023-11-30 ENCOUNTER — Ambulatory Visit (INDEPENDENT_AMBULATORY_CARE_PROVIDER_SITE_OTHER): Payer: Medicaid Other

## 2023-11-30 DIAGNOSIS — Z3042 Encounter for surveillance of injectable contraceptive: Secondary | ICD-10-CM

## 2023-11-30 MED ORDER — MEDROXYPROGESTERONE ACETATE 150 MG/ML IM SUSP
150.0000 mg | INTRAMUSCULAR | Status: AC
  Administered 2023-11-30: 150 mg via INTRAMUSCULAR

## 2023-11-30 NOTE — Progress Notes (Signed)
 Per orders of Dr. Kerby Nora, injection of medroxyprogesterone 150 mg IM given by Lewanda Rife in [facility] per pts request.. Patient tolerated injection well. Patient will make appointment for 3 month.

## 2023-12-11 DIAGNOSIS — Z419 Encounter for procedure for purposes other than remedying health state, unspecified: Secondary | ICD-10-CM | POA: Diagnosis not present

## 2024-01-10 DIAGNOSIS — Z419 Encounter for procedure for purposes other than remedying health state, unspecified: Secondary | ICD-10-CM | POA: Diagnosis not present

## 2024-02-10 DIAGNOSIS — Z419 Encounter for procedure for purposes other than remedying health state, unspecified: Secondary | ICD-10-CM | POA: Diagnosis not present

## 2024-02-23 ENCOUNTER — Ambulatory Visit (INDEPENDENT_AMBULATORY_CARE_PROVIDER_SITE_OTHER)

## 2024-02-23 DIAGNOSIS — Z3042 Encounter for surveillance of injectable contraceptive: Secondary | ICD-10-CM

## 2024-02-23 MED ORDER — MEDROXYPROGESTERONE ACETATE 150 MG/ML IM SUSP
150.0000 mg | INTRAMUSCULAR | Status: AC
  Administered 2024-02-23: 150 mg via INTRAMUSCULAR

## 2024-02-23 NOTE — Progress Notes (Signed)
 Per orders of Dr. Greig Ring, injection of Medroxyprogesterone  150 mg IM given by Laray Arenas in [facility] per pts request. Patient tolerated injection well. Patient will make appointment for 3 month.

## 2024-03-11 DIAGNOSIS — Z419 Encounter for procedure for purposes other than remedying health state, unspecified: Secondary | ICD-10-CM | POA: Diagnosis not present

## 2024-04-11 DIAGNOSIS — Z419 Encounter for procedure for purposes other than remedying health state, unspecified: Secondary | ICD-10-CM | POA: Diagnosis not present

## 2024-05-12 DIAGNOSIS — Z419 Encounter for procedure for purposes other than remedying health state, unspecified: Secondary | ICD-10-CM | POA: Diagnosis not present

## 2024-05-16 ENCOUNTER — Ambulatory Visit

## 2024-05-16 DIAGNOSIS — Z3042 Encounter for surveillance of injectable contraceptive: Secondary | ICD-10-CM | POA: Diagnosis not present

## 2024-05-16 MED ORDER — MEDROXYPROGESTERONE ACETATE 150 MG/ML IM SUSP
150.0000 mg | INTRAMUSCULAR | Status: AC
  Administered 2024-05-16: 150 mg via INTRAMUSCULAR

## 2024-05-16 NOTE — Progress Notes (Signed)
 Per orders of Dr. Greig Ring, injection of Depo provera  150 mg IM given by Laray Arenas in [facility] AT PTS REQUEST.SABRA Patient tolerated injection well. Patient will make appointment for 3 month.

## 2024-07-12 DIAGNOSIS — Z419 Encounter for procedure for purposes other than remedying health state, unspecified: Secondary | ICD-10-CM | POA: Diagnosis not present

## 2024-08-04 ENCOUNTER — Telehealth: Payer: Self-pay

## 2024-08-04 NOTE — Telephone Encounter (Signed)
 Pt has not been seen at [facility] since 05/15/2022. Dr Avelina said to schedule CPX with no labs til pt seen. Pt will need Depo inj also. Last Depo shot was 05/16/24 so next Depo inj should be between 08/01/2024 and 08/15/24. Pt voiced understanding and only day she could come in was 08/10/24 at 8:40 due to pts schedule. CPX scheduled and [place]land CMA oked.

## 2024-08-08 ENCOUNTER — Ambulatory Visit

## 2024-08-10 ENCOUNTER — Ambulatory Visit: Admitting: Family Medicine

## 2024-08-10 ENCOUNTER — Ambulatory Visit: Payer: Self-pay | Admitting: Family Medicine

## 2024-08-10 ENCOUNTER — Encounter: Payer: Self-pay | Admitting: Family Medicine

## 2024-08-10 ENCOUNTER — Other Ambulatory Visit (HOSPITAL_COMMUNITY): Admission: RE | Admit: 2024-08-10 | Source: Ambulatory Visit

## 2024-08-10 VITALS — BP 118/66 | HR 95 | Temp 98.7°F | Ht 63.0 in | Wt 147.2 lb

## 2024-08-10 DIAGNOSIS — R87610 Atypical squamous cells of undetermined significance on cytologic smear of cervix (ASC-US): Secondary | ICD-10-CM | POA: Diagnosis not present

## 2024-08-10 DIAGNOSIS — Z23 Encounter for immunization: Secondary | ICD-10-CM | POA: Diagnosis not present

## 2024-08-10 DIAGNOSIS — Z3042 Encounter for surveillance of injectable contraceptive: Secondary | ICD-10-CM | POA: Diagnosis not present

## 2024-08-10 DIAGNOSIS — Z0000 Encounter for general adult medical examination without abnormal findings: Secondary | ICD-10-CM

## 2024-08-10 DIAGNOSIS — Z13220 Encounter for screening for lipoid disorders: Secondary | ICD-10-CM

## 2024-08-10 DIAGNOSIS — Z130 Encounter for screening for diseases of the blood and blood-forming organs and certain disorders involving the immune mechanism: Secondary | ICD-10-CM | POA: Diagnosis not present

## 2024-08-10 LAB — CBC WITH DIFFERENTIAL/PLATELET
Basophils Absolute: 0 K/uL (ref 0.0–0.1)
Basophils Relative: 0.6 % (ref 0.0–3.0)
Eosinophils Absolute: 0 K/uL (ref 0.0–0.7)
Eosinophils Relative: 0.9 % (ref 0.0–5.0)
HCT: 40.5 % (ref 36.0–46.0)
Hemoglobin: 13.9 g/dL (ref 12.0–15.0)
Lymphocytes Relative: 27.6 % (ref 12.0–46.0)
Lymphs Abs: 1.4 K/uL (ref 0.7–4.0)
MCHC: 34.3 g/dL (ref 30.0–36.0)
MCV: 84.9 fl (ref 78.0–100.0)
Monocytes Absolute: 0.6 K/uL (ref 0.1–1.0)
Monocytes Relative: 12.1 % — ABNORMAL HIGH (ref 3.0–12.0)
Neutro Abs: 2.9 K/uL (ref 1.4–7.7)
Neutrophils Relative %: 58.8 % (ref 43.0–77.0)
Platelets: 272 K/uL (ref 150.0–400.0)
RBC: 4.77 Mil/uL (ref 3.87–5.11)
RDW: 13.4 % (ref 11.5–15.5)
WBC: 5 K/uL (ref 4.0–10.5)

## 2024-08-10 LAB — COMPREHENSIVE METABOLIC PANEL WITH GFR
ALT: 31 U/L (ref 0–35)
AST: 25 U/L (ref 0–37)
Albumin: 4.4 g/dL (ref 3.5–5.2)
Alkaline Phosphatase: 111 U/L (ref 39–117)
BUN: 14 mg/dL (ref 6–23)
CO2: 27 meq/L (ref 19–32)
Calcium: 9.6 mg/dL (ref 8.4–10.5)
Chloride: 106 meq/L (ref 96–112)
Creatinine, Ser: 0.95 mg/dL (ref 0.40–1.20)
GFR: 80.61 mL/min (ref >60.00)
Glucose, Bld: 99 mg/dL (ref 70–99)
Potassium: 4 meq/L (ref 3.5–5.1)
Sodium: 141 meq/L (ref 135–145)
Total Bilirubin: 0.3 mg/dL (ref 0.2–1.2)
Total Protein: 7.4 g/dL (ref 6.0–8.3)

## 2024-08-10 LAB — LIPID PANEL
Cholesterol: 161 mg/dL (ref 0–200)
HDL: 46.9 mg/dL (ref >39.00)
LDL Cholesterol: 97 mg/dL (ref 0–99)
NonHDL: 114.44
Total CHOL/HDL Ratio: 3
Triglycerides: 87 mg/dL (ref 0.0–149.0)
VLDL: 17.4 mg/dL (ref 0.0–40.0)

## 2024-08-10 MED ORDER — MEDROXYPROGESTERONE ACETATE 150 MG/ML IM SUSP
150.0000 mg | Freq: Once | INTRAMUSCULAR | Status: AC
  Administered 2024-08-10: 150 mg via INTRAMUSCULAR

## 2024-08-10 NOTE — Progress Notes (Signed)
 Patient ID: Heidi Orr, female    DOB: 1995/10/05, 28 y.o.   MRN: [id number]  This visit was conducted in person.  BP 118/66 (BP Location: Left Arm, Patient Position: Sitting, Cuff Size: Normal)   Pulse 95   Temp 98.7 F (37.1 C) (Temporal)   Ht 5' 3 (1.6 m)   Wt 147 lb 3.2 oz (66.8 kg)   SpO2 98%   BMI 26.08 kg/m    CC:  Chief Complaint  Patient presents with   Annual Exam    Subjective:   HPI: Heidi Orr is a 28 y.o. female presenting on 08/10/2024 for Annual Exam  The patient presents for complete physical and review of chronic health problems. She also has the following acute concerns today:    Contraception: Depo.. last injection 05/16/2024.. working well for her.  No menses on this medication.    Diet: Moderate.. healthy off and on  Exercise: minimal other than walking at work [employer] Readings from Last 3 Encounters:  08/10/24 147 lb 3.2 oz (66.8 kg)  09/15/23 130 lb (59 kg)  05/15/22 133 lb 8 oz (60.6 kg)   No concern for STD exposure.  Flowsheet Row Office Visit from 08/10/2024 in [facility]  PHQ-2 Total Score 0    Relevant past medical, surgical, family and social history reviewed and updated as indicated. Interim medical history since our last visit reviewed. Allergies and medications reviewed and updated. Outpatient Medications Prior to Visit  Medication Sig Dispense Refill   medroxyPROGESTERone  (DEPO-PROVERA ) 150 MG/ML injection Inject 150 mg into the muscle every 3 (three) months.     ibuprofen  (ADVIL ) 600 MG tablet Take 1 tablet (600 mg total) by mouth every 6 (six) hours. 30 tablet 0   ondansetron  (ZOFRAN ) 4 MG tablet Take 1 tablet (4 mg total) by mouth every 8 (eight) hours as needed for nausea or vomiting. 20 tablet 0   oxyCODONE  (ROXICODONE ) 5 MG immediate release tablet Take 1 tablet (5 mg total) by mouth every 4 (four) hours as needed for severe pain (pain score 7-10). 20 tablet 0   tamsulosin   (FLOMAX ) 0.4 MG CAPS capsule Take 1 capsule (0.4 mg total) by mouth daily. 30 capsule 0   Facility-Administered Medications Prior to Visit  Medication Dose Route Frequency Provider Last Rate Last Admin   medroxyPROGESTERone  (DEPO-PROVERA ) injection 150 mg  150 mg Intramuscular Q90 days Veeda Virgo E, MD   150 mg at 10/27/22 0858   medroxyPROGESTERone  (DEPO-PROVERA ) injection 150 mg  150 mg Intramuscular Q90 days Jaelyn Cloninger E, MD   150 mg at 06/22/23 9092   medroxyPROGESTERone  (DEPO-PROVERA ) injection 150 mg  150 mg Intramuscular Q90 days Dyshaun Bonzo E, MD   150 mg at 09/08/23 0947   medroxyPROGESTERone  (DEPO-PROVERA ) injection 150 mg  150 mg Intramuscular Q90 days Teauna Dubach E, MD   150 mg at 11/30/23 9063   medroxyPROGESTERone  (DEPO-PROVERA ) injection 150 mg  150 mg Intramuscular Q90 days Alexes Lamarque E, MD   150 mg at 02/23/24 9073   medroxyPROGESTERone  (DEPO-PROVERA ) injection 150 mg  150 mg Intramuscular Q90 days Mckinley Adelstein E, MD   150 mg at 05/16/24 1649     Per HPI unless specifically indicated in ROS section below Review of Systems  Constitutional:  Negative for fatigue and fever.  HENT:  Negative for congestion.   Eyes:  Negative for pain.  Respiratory:  Negative for cough and shortness of breath.   Cardiovascular:  Negative for chest  pain, palpitations and leg swelling.  Gastrointestinal:  Negative for abdominal pain.  Genitourinary:  Negative for dysuria and vaginal bleeding.  Musculoskeletal:  Negative for back pain.  Neurological:  Negative for syncope, light-headedness and headaches.  Psychiatric/Behavioral:  Negative for dysphoric mood.     Objective:  BP 118/66 (BP Location: Left Arm, Patient Position: Sitting, Cuff Size: Normal)   Pulse 95   Temp 98.7 F (37.1 C) (Temporal)   Ht 5' 3 (1.6 m)   Wt 147 lb 3.2 oz (66.8 kg)   SpO2 98%   BMI 26.08 kg/m   Wt Readings from Last 3 Encounters:  08/10/24 147 lb 3.2 oz (66.8 kg)  09/15/23 130 lb (59 kg)  05/15/22  133 lb 8 oz (60.6 kg)      Physical Exam Constitutional:      General: She is not in acute distress.    Appearance: Normal appearance. She is well-developed. She is not ill-appearing or toxic-appearing.  HENT:     Head: Normocephalic.     Right Ear: Hearing, tympanic membrane, ear canal and external ear normal. Tympanic membrane is not erythematous, retracted or bulging.     Left Ear: Hearing, tympanic membrane, ear canal and external ear normal. Tympanic membrane is not erythematous, retracted or bulging.     Nose: No mucosal edema or rhinorrhea.     Right Sinus: No maxillary sinus tenderness or frontal sinus tenderness.     Left Sinus: No maxillary sinus tenderness or frontal sinus tenderness.     Mouth/Throat:     Pharynx: Uvula midline.  Eyes:     General: Lids are normal. Lids are everted, no foreign bodies appreciated.     Conjunctiva/sclera: Conjunctivae normal.     Pupils: Pupils are equal, round, and reactive to light.  Neck:     Thyroid : No thyroid  mass or thyromegaly.     Vascular: No carotid bruit.     Trachea: Trachea normal.  Cardiovascular:     Rate and Rhythm: Normal rate and regular rhythm.     Pulses: Normal pulses.     Heart sounds: Normal heart sounds, S1 normal and S2 normal. No murmur heard.    No friction rub. No gallop.  Pulmonary:     Effort: Pulmonary effort is normal. No tachypnea or respiratory distress.     Breath sounds: Normal breath sounds. No decreased breath sounds, wheezing, rhonchi or rales.  Abdominal:     General: Bowel sounds are normal.     Palpations: Abdomen is soft.     Tenderness: There is no abdominal tenderness.  Musculoskeletal:     Cervical back: Normal range of motion and neck supple.  Skin:    General: Skin is warm and dry.     Findings: No rash.  Neurological:     Mental Status: She is alert.  Psychiatric:        Mood and Affect: Mood is not anxious or depressed.        Speech: Speech normal.        Behavior: Behavior  normal. Behavior is cooperative.        Thought Content: Thought content normal.        Judgment: Judgment normal.       Results for orders placed or performed during the hospital encounter of 09/15/23  Urine Culture   Collection Time: 09/15/23 10:44 PM   Specimen: Urine, Clean Catch  Result Value Ref Range   Specimen Description      URINE, CLEAN CATCH  Performed at [facility], [address], [place], [place] [address]    Special Requests      NONE Performed at [facility], [address], [place], [place] [address]    Culture MULTIPLE SPECIES PRESENT, SUGGEST RECOLLECTION (A)    Report Status 09/17/2023 FINAL   CBC with Differential   Collection Time: 09/15/23 10:44 PM  Result Value Ref Range   WBC 14.6 (H) 4.0 - 10.5 K/uL   RBC 4.50 3.87 - 5.11 MIL/uL   Hemoglobin 12.8 12.0 - 15.0 g/dL   HCT 60.5 63.9 - 53.9 %   MCV 87.6 80.0 - 100.0 fL   MCH 28.4 26.0 - 34.0 pg   MCHC 32.5 30.0 - 36.0 g/dL   RDW 87.0 88.4 - 84.4 %   Platelets 191 150 - 400 K/uL   nRBC 0.0 0.0 - 0.2 %   Neutrophils Relative % 87 %   Neutro Abs 12.6 (H) 1.7 - 7.7 K/uL   Lymphocytes Relative 9 %   Lymphs Abs 1.3 0.7 - 4.0 K/uL   Monocytes Relative 4 %   Monocytes Absolute 0.6 0.1 - 1.0 K/uL   Eosinophils Relative 0 %   Eosinophils Absolute 0.0 0.0 - 0.5 K/uL   Basophils Relative 0 %   Basophils Absolute 0.1 0.0 - 0.1 K/uL   Immature Granulocytes 0 %   Abs Immature Granulocytes 0.05 0.00 - 0.07 K/uL  Urinalysis, Routine w reflex microscopic -Urine, Clean Catch   Collection Time: 09/15/23 10:44 PM  Result Value Ref Range   Color, Urine YELLOW YELLOW   APPearance CLOUDY (A) CLEAR   Specific Gravity, Urine 1.032 (H) 1.005 - 1.030   pH 5.0 5.0 - 8.0   Glucose, UA 150 (A) NEGATIVE mg/dL   Hgb urine dipstick LARGE (A) NEGATIVE   Bilirubin Urine NEGATIVE NEGATIVE   Ketones, ur 5 (A) NEGATIVE mg/dL   Protein, ur 899 (A) NEGATIVE mg/dL   Nitrite NEGATIVE  NEGATIVE   Leukocytes,Ua MODERATE (A) NEGATIVE   RBC / HPF >50 0 - 5 RBC/hpf   WBC, UA 21-50 0 - 5 WBC/hpf   Bacteria, UA NONE SEEN NONE SEEN   Squamous Epithelial / HPF 21-50 0 - 5 /HPF   Mucus PRESENT    Hyaline Casts, UA PRESENT   Pregnancy, urine   Collection Time: 09/15/23 10:44 PM  Result Value Ref Range   Preg Test, Ur NEGATIVE NEGATIVE  I-stat chem 8, ED (not at [facility], [facility] or [facility])   Collection Time: 09/16/23  4:15 AM  Result Value Ref Range   Sodium 143 135 - 145 mmol/L   Potassium 4.6 3.5 - 5.1 mmol/L   Chloride 113 (H) 98 - 111 mmol/L   BUN 16 6 - 20 mg/dL   Creatinine, Ser 8.79 (H) 0.44 - 1.00 mg/dL   Glucose, Bld 861 (H) 70 - 99 mg/dL   Calcium, Ion 1.16 1.15 - 1.40 mmol/L   TCO2 20 (L) 22 - 32 mmol/L   Hemoglobin 13.3 12.0 - 15.0 g/dL   HCT 60.9 63.9 - 53.9 %    Assessment and Plan The patient's preventative maintenance and recommended screening tests for an annual wellness exam were reviewed in full today. Brought up to date unless services declined.  Counselled on the importance of diet, exercise, and its role in overall health and mortality. The patient's FH and SH was reviewed, including their home life, tobacco status, and drug and alcohol status.   Vaccines:Due for flu shot, UPTODATE  with Tdap Pap/DVE: ASCUS, neg HPV 11/2019... q 3 year recall, overdue for re-eval. Mammo:  no indicated, no early family history  Bone Density:not indicated Colon:  no early family history Smoking Status:none ETOH/ drug ldz:wnwz/wnwz  Hep C: done  HIV screen:  done    Routine general medical examination at a health care facility  Screening, iron deficiency anemia -     CBC with Differential/Platelet  Screening cholesterol level -     Lipid panel -     Comprehensive metabolic panel with GFR  ASCUS of cervix with negative high risk HPV -     Cytology - PAP     No follow-ups on file.   Greig Ring, MD

## 2024-08-10 NOTE — Patient Instructions (Signed)
 Please stop at the lab to have labs drawn.

## 2024-08-10 NOTE — Addendum Note (Signed)
 Addended by: ALBINO SHAVER C on: 08/10/2024 09:22 AM   Modules accepted: Orders

## 2024-08-10 NOTE — Addendum Note (Signed)
 Addended by: ALBINO SHAVER C on: 08/10/2024 09:26 AM   Modules accepted: Orders

## 2024-08-15 LAB — CYTOLOGY - PAP
Comment: NEGATIVE
Comment: NEGATIVE
Comment: NEGATIVE
Diagnosis: UNDETERMINED — AB
HPV 16: NEGATIVE
HPV 18 / 45: NEGATIVE
High risk HPV: POSITIVE — AB

## 2024-11-08 ENCOUNTER — Ambulatory Visit

## 2025-08-06 ENCOUNTER — Other Ambulatory Visit

## 2025-08-14 ENCOUNTER — Encounter: Admitting: Family Medicine
